# Patient Record
Sex: Female | Born: 1990 | Hispanic: Yes | Marital: Single | Smoking: Never smoker
Health system: Southern US, Community
[De-identification: ages and names within clinical notes are randomized; demographics above are authoritative.]

## PROBLEM LIST (undated history)

## (undated) DIAGNOSIS — J45909 Unspecified asthma, uncomplicated: Secondary | ICD-10-CM

## (undated) DIAGNOSIS — K589 Irritable bowel syndrome without diarrhea: Secondary | ICD-10-CM

## (undated) DIAGNOSIS — N200 Calculus of kidney: Secondary | ICD-10-CM

## (undated) HISTORY — PX: KIDNEY STONE SURGERY: SHX686

## (undated) HISTORY — PX: CHOLECYSTECTOMY: SHX55

## (undated) HISTORY — PX: LASIK: SHX215

---

## 2015-01-27 DIAGNOSIS — R5382 Chronic fatigue, unspecified: Secondary | ICD-10-CM | POA: Insufficient documentation

## 2015-01-27 DIAGNOSIS — K580 Irritable bowel syndrome with diarrhea: Secondary | ICD-10-CM | POA: Insufficient documentation

## 2015-01-27 DIAGNOSIS — K219 Gastro-esophageal reflux disease without esophagitis: Secondary | ICD-10-CM | POA: Insufficient documentation

## 2015-01-27 DIAGNOSIS — E282 Polycystic ovarian syndrome: Secondary | ICD-10-CM | POA: Insufficient documentation

## 2015-01-28 DIAGNOSIS — R519 Headache, unspecified: Secondary | ICD-10-CM | POA: Insufficient documentation

## 2016-06-07 DIAGNOSIS — F341 Dysthymic disorder: Secondary | ICD-10-CM | POA: Insufficient documentation

## 2016-06-07 DIAGNOSIS — L659 Nonscarring hair loss, unspecified: Secondary | ICD-10-CM | POA: Insufficient documentation

## 2017-06-12 DIAGNOSIS — N200 Calculus of kidney: Secondary | ICD-10-CM | POA: Insufficient documentation

## 2017-09-16 DIAGNOSIS — R109 Unspecified abdominal pain: Secondary | ICD-10-CM | POA: Insufficient documentation

## 2018-01-22 DIAGNOSIS — R7401 Elevation of levels of liver transaminase levels: Secondary | ICD-10-CM | POA: Insufficient documentation

## 2019-04-17 DIAGNOSIS — Z20828 Contact with and (suspected) exposure to other viral communicable diseases: Secondary | ICD-10-CM | POA: Insufficient documentation

## 2020-02-23 ENCOUNTER — Emergency Department: Payer: Self-pay

## 2020-02-23 ENCOUNTER — Emergency Department
Admission: EM | Admit: 2020-02-23 | Discharge: 2020-02-23 | Disposition: A | Discharge status: Home / Self Care | Payer: Self-pay | Attending: Emergency Medicine | Admitting: Emergency Medicine

## 2020-02-23 ENCOUNTER — Other Ambulatory Visit: Payer: Self-pay

## 2020-02-23 ENCOUNTER — Encounter: Payer: Self-pay | Admitting: Emergency Medicine

## 2020-02-23 DIAGNOSIS — Z9049 Acquired absence of other specified parts of digestive tract: Secondary | ICD-10-CM | POA: Insufficient documentation

## 2020-02-23 DIAGNOSIS — J4521 Mild intermittent asthma with (acute) exacerbation: Secondary | ICD-10-CM | POA: Insufficient documentation

## 2020-02-23 HISTORY — DX: Unspecified asthma, uncomplicated: J45.909

## 2020-02-23 HISTORY — DX: Calculus of kidney: N20.0

## 2020-02-23 HISTORY — DX: Irritable bowel syndrome, unspecified: K58.9

## 2020-02-23 MED ORDER — ALBUTEROL SULFATE HFA 108 (90 BASE) MCG/ACT IN AERS: 2.0000 | INHALATION_SPRAY | Freq: Four times a day (QID) | RESPIRATORY_TRACT | 0 refills | Status: DC | PRN

## 2020-02-23 NOTE — ED Provider Notes (Signed)
[facility] Emergency Department Provider Note  ____________________________________________   First MD Initiated Contact with Patient 02/23/20 1243     (approximate)  I have reviewed the triage vital signs and the nursing notes.   HISTORY  Chief Complaint Shortness of Breath    HPI Patricia Bowers is a 29 y.o. female presents emergency department via EMS for shortness of breath.  Patient has asthma and is easily triggered by cigarette smoke.  States they have a neighbor that smokes heavily.  He smokes outside after a window in front door which affects her breathing.  States she had a lot of difficulty breathing after she opened the door and inhaled the smoke.  She denies any chest pain or shortness of breath at this time.  She does not have an inhaler at home.  She denies any fever or chills although she states she did feel warm earlier today.  No Covid exposures.  Remainder review of systems is negative    Past Medical History:  Diagnosis Date  . Asthma   . IBS (irritable bowel syndrome)   . Kidney stone     There are no problems to display for this patient.   Past Surgical History:  Procedure Laterality Date  . CHOLECYSTECTOMY    . KIDNEY STONE SURGERY    . LASIK      Prior to Admission medications   Medication Sig Start Date End Date Taking? Authorizing Provider  albuterol (VENTOLIN HFA) 108 (90 Base) MCG/ACT inhaler Inhale 2 puffs into the lungs every 6 (six) hours as needed for wheezing or shortness of breath. 02/23/20   Faythe Ghee, PA-C    Allergies Morphine and related  No family history on file.  Social History Social History   Tobacco Use  . Smoking status: Never Smoker  . Smokeless tobacco: Never Used  Substance Use Topics  . Alcohol use: Never  . Drug use: Not on file    Review of Systems  Constitutional: No fever/chills Eyes: No visual changes. ENT: No sore throat. Respiratory: Denies cough, difficulty  breathing with asthma Cardiovascular: Denies chest pain Gastrointestinal: Denies abdominal pain Genitourinary: Negative for dysuria. Musculoskeletal: Negative for back pain. Skin: Negative for rash. Psychiatric: no mood changes,     ____________________________________________   PHYSICAL EXAM:  VITAL SIGNS: ED Triage Vitals  Enc Vitals Group     BP 02/23/20 1201 118/72     Pulse Rate 02/23/20 1201 88     Resp 02/23/20 1201 16     Temp 02/23/20 1201 98.7 F (37.1 C)     Temp Source 02/23/20 1201 Oral     SpO2 02/23/20 1201 97 %     Weight 02/23/20 1201 260 lb (117.9 kg)     Height 02/23/20 1201 5\' 3"  (1.6 m)     Head Circumference --      Peak Flow --      Pain Score 02/23/20 1200 4     Pain Loc --      Pain Edu? --      Excl. in GC? --     Constitutional: Alert and oriented. Well appearing and in no acute distress. Eyes: Conjunctivae are normal.  Head: Atraumatic. Nose: No congestion/rhinnorhea. Neck:  supple no lymphadenopathy noted Cardiovascular: Normal rate, regular rhythm. Heart sounds are normal Respiratory: Normal respiratory effort.  No retractions, lungs c t a, no wheezing GU: deferred Musculoskeletal: FROM all extremities, warm and well perfused Neurologic:  Normal speech and language.  Skin:  Skin is warm, dry and intact. No rash noted. Psychiatric: Mood and affect are normal. Speech and behavior are normal.  ____________________________________________   LABS (all labs ordered are listed, but only abnormal results are displayed)  Labs Reviewed - No data to display ____________________________________________   ____________________________________________  RADIOLOGY    ____________________________________________   PROCEDURES  Procedure(s) performed: No  Procedures    ____________________________________________   INITIAL IMPRESSION / ASSESSMENT AND PLAN / ED COURSE  Pertinent labs & imaging results that were available during  my care of the patient were reviewed by me and considered in my medical decision making (see chart for details).   Patient is 29 year old female presents emergency department for an asthma exasperation see HPI  Physical exam shows patient to appear well.  Vitals are normal.  Lungs clear to all station.  Remainder exams are unremarkable  Explained to the patient that the hospital cannot do anything about her neighbor.  Unfortunately she will need to once again contact the apartment Freight forwarder.  She was given a prescription for an albuterol inhaler which was sent to medication management.  Patient states she is unemployed and uninsured at this time so told her they would be able to help her.  I do not feel that she needs a nebulizer treatment or chest x-ray at this time.  Patient seems to be more upset about the fact that her neighbor keeps smoking and causes exacerbations of her asthma.  She was discharged stable condition.    Patricia Bowers was evaluated in Emergency Department on 02/23/2020 for the symptoms described in the history of present illness. She was evaluated in the context of the global COVID-19 pandemic, which necessitated consideration that the patient might be at risk for infection with the SARS-CoV-2 virus that causes COVID-19. Institutional protocols and algorithms that pertain to the evaluation of patients at risk for COVID-19 are in a state of rapid change based on information released by regulatory bodies including the CDC and federal and state organizations. These policies and algorithms were followed during the patient's care in the ED.   As part of my medical decision making, I reviewed the following data within the [place] notes reviewed and incorporated, Old chart reviewed, Notes from prior ED visits and [place] Controlled Substance Database  ____________________________________________   FINAL CLINICAL IMPRESSION(S) / ED DIAGNOSES  Final diagnoses:    Mild intermittent asthma with acute exacerbation      NEW MEDICATIONS STARTED DURING THIS VISIT:  New Prescriptions   ALBUTEROL (VENTOLIN HFA) 108 (90 BASE) MCG/ACT INHALER    Inhale 2 puffs into the lungs every 6 (six) hours as needed for wheezing or shortness of breath.     Note:  This document was prepared using Dragon voice recognition software and may include unintentional dictation errors.    Versie Starks, PA-C 02/23/20 1301    Blake Divine, MD 02/23/20 [phone]

## 2020-02-23 NOTE — ED Triage Notes (Signed)
Patient presents to the ED via EMS for shortness of breath.  Patient reports history of asthma and states that people in her apartment building have been smoking pot and the smell/smoke is exacerbating her asthma and causing her to have difficulty breathing.  Patient reports some chest tightness due to rapid breathing.  Patient is in no obvious distress at this time.

## 2020-02-23 NOTE — Discharge Instructions (Addendum)
Follow-up with your regular doctor or one of the discounted clinics as needed.  Return emergency department if worsening.  Use the inhaler as prescribed.  Wear your mask when you start to go outside.  This should help decrease some exposure to cigarette smoke that is outside.

## 2020-03-30 ENCOUNTER — Telehealth: Payer: Self-pay | Admitting: Pharmacy Technician

## 2020-03-30 NOTE — Telephone Encounter (Signed)
Patient failed to provide 2021 proof of income.  No additional medication assistance will be provided by [facility] without the required proof of income documentation.  Patient notified by letter.  Patricia Bowers Care Manager [facility]   [address] [place], [place]  [address]     This is to inform you that you are no longer eligible to receive medication assistance at [facility].  The reason(s) are:    _____Your total gross monthly household income exceeds 250% of the Federal Poverty Level.   _____Tangible assets (savings, checking, stocks/bonds, pension, retirement, etc.) exceeds our limit  _____You are eligible to receive benefits from Medicaid, [facility] or HIV Medication              Assistance Program _____You are eligible to receive benefits from a Medicare Part "D" plan _____You have prescription insurance  _____You are not an [place] resident __X__Failure to provide all requested proof of income information for 2021.    Medication assistance will resume once all requested financial information has been returned to our clinic.  If you have questions, please contact our clinic at [phone].    Thank you,  [facility] 

## 2020-05-23 ENCOUNTER — Ambulatory Visit: Payer: Self-pay | Admitting: Gerontology

## 2020-05-25 ENCOUNTER — Ambulatory Visit: Payer: Self-pay | Admitting: Gerontology

## 2020-05-25 ENCOUNTER — Other Ambulatory Visit: Payer: Self-pay

## 2020-05-25 ENCOUNTER — Encounter: Payer: Self-pay | Admitting: Gerontology

## 2020-05-25 VITALS — BP 137/98 | HR 89 | Ht 63.0 in | Wt 263.0 lb

## 2020-05-25 DIAGNOSIS — Z8709 Personal history of other diseases of the respiratory system: Secondary | ICD-10-CM | POA: Insufficient documentation

## 2020-05-25 DIAGNOSIS — Z7689 Persons encountering health services in other specified circumstances: Secondary | ICD-10-CM

## 2020-05-25 DIAGNOSIS — Z8719 Personal history of other diseases of the digestive system: Secondary | ICD-10-CM

## 2020-05-25 MED ORDER — ALBUTEROL SULFATE HFA 108 (90 BASE) MCG/ACT IN AERS: 2.0000 | INHALATION_SPRAY | Freq: Four times a day (QID) | RESPIRATORY_TRACT | 0 refills | Status: DC | PRN

## 2020-05-25 MED ORDER — FLUTICASONE-SALMETEROL 250-50 MCG/DOSE IN AEPB: 1.0000 | INHALATION_SPRAY | Freq: Every day | RESPIRATORY_TRACT | 1 refills | Status: DC

## 2020-05-25 MED ORDER — SALINE SPRAY 0.65 % NA SOLN: 1.0000 | NASAL | 0 refills | Status: DC | PRN

## 2020-05-25 NOTE — Progress Notes (Signed)
Patient ID: Patricia Bowers, female   DOB: [birth year], 29 y.o.   MRN: [id number]  Chief Complaint  Patient presents with  . Establish Care  . Recurrent Sinusitis    feels ongoing since Nov, mold was found in old apartment, 2 bouts of E Coli, sinus drainage and cough for several months  . Cough    HPI Patricia Bowers is a 29 y.o. female who presents to establish care and evaluation of her chronic conditions. She was seen at [facility] on 05/06/2020 for insect bite to right lower leg and was treated with Mupirocin antibiotic ointment. Currently, she states that the insect bite to her right lower leg has resolved.She was also seen at the ED on 02/23/2020 for Shortness of breath. She has a history of Asthma and she uses Albuterol as needed. Currently, she states that she experiences mild intermittent shortness of breath with exertion, mild chest tightness and non productive cough sometimes. She also c/o sore throat, sinus pressure, postnasal drip that has been going on since 10/2019. She denies rhinorrhea, otalgia and headache. She also states that she has a history of Inflammatory Bowel Disease (IBS), continues to have fifteen loose stools daily. She denies abdominal pain and hematochezia. Overall, she states that she's doing well and offers no further complaint.   Past Medical History:  Diagnosis Date  . Asthma   . IBS (irritable bowel syndrome)   . Kidney stone     Past Surgical History:  Procedure Laterality Date  . CHOLECYSTECTOMY    . KIDNEY STONE SURGERY    . LASIK      History reviewed. No pertinent family history.  Social History Social History   Tobacco Use  . Smoking status: Never Smoker  . Smokeless tobacco: Never Used  Substance Use Topics  . Alcohol use: Never  . Drug use: Never    Allergies  Allergen Reactions  . Morphine And Related Other (See Comments)    Dizziness, nausea, nightmares    Current Outpatient Medications  Medication Sig Dispense Refill   . albuterol (VENTOLIN HFA) 108 (90 Base) MCG/ACT inhaler Inhale 2 puffs into the lungs every 6 (six) hours as needed for wheezing or shortness of breath. 16 g 0   No current facility-administered medications for this visit.    Review of Systems Review of Systems  HENT: Positive for postnasal drip, sinus pressure, sinus pain and sore throat.   Eyes: Negative.   Respiratory: Positive for chest tightness. Negative for wheezing.   Gastrointestinal: Negative.   Endocrine: Negative.   Genitourinary: Negative.   Musculoskeletal: Negative.   Skin: Negative.   Neurological: Negative.   Psychiatric/Behavioral: Negative.     Blood pressure (!) 137/98, pulse 89, height '5\' 3"'  (1.6 m), weight 263 lb (119.3 kg), SpO2 98 %.  Physical Exam Physical Exam Constitutional:      Appearance: Normal appearance.  HENT:     Head: Normocephalic and atraumatic.     Right Ear: Tympanic membrane, ear canal and external ear normal.     Left Ear: Tympanic membrane, ear canal and external ear normal.     Nose: Nose normal. No nasal deformity, septal deviation, nasal tenderness or mucosal edema.     Right Turbinates: Not enlarged, swollen or pale.     Left Turbinates: Not enlarged, swollen or pale.     Right Sinus: No maxillary sinus tenderness or frontal sinus tenderness.     Left Sinus: No maxillary sinus tenderness or frontal sinus tenderness.     Mouth/Throat:  Lips: Pink.     Mouth: Mucous membranes are moist.     Pharynx: Oropharynx is clear. Uvula midline. No oropharyngeal exudate or posterior oropharyngeal erythema.     Tonsils: No tonsillar exudate or tonsillar abscesses.  Eyes:     Extraocular Movements: Extraocular movements intact.     Conjunctiva/sclera: Conjunctivae normal.     Pupils: Pupils are equal, round, and reactive to light.  Cardiovascular:     Rate and Rhythm: Normal rate and regular rhythm.     Pulses: Normal pulses.     Heart sounds: Normal heart sounds.  Pulmonary:      Effort: Pulmonary effort is normal.     Breath sounds: Normal breath sounds.  Abdominal:     General: Bowel sounds are normal.     Palpations: Abdomen is soft.  Genitourinary:    Comments: Deferred per patient. Musculoskeletal:        General: Normal range of motion.     Cervical back: Normal range of motion.  Skin:    General: Skin is warm and dry.  Neurological:     General: No focal deficit present.     Mental Status: She is alert and oriented to person, place, and time. Mental status is at baseline.  Psychiatric:        Mood and Affect: Mood normal.        Behavior: Behavior normal.        Thought Content: Thought content normal.        Judgment: Judgment normal.     Data Reviewed Lab and past medical history was reviewed.  Assessment and Plan  1. History of asthma -  She will continue on current medication, was educated on side effects and advised to notify clinic. - albuterol (VENTOLIN HFA) 108 (90 Base) MCG/ACT inhaler; Inhale 2 puffs into the lungs every 6 (six) hours as needed for wheezing or shortness of breath.  Dispense: 16 g; Refill: 0 - Fluticasone-Salmeterol (ADVAIR) 250-50 MCG/DOSE AEPB; Inhale 1 puff into the lungs daily.  Dispense: 60 each; Refill: 1 - sodium chloride (OCEAN) 0.65 % SOLN nasal spray; Place 1 spray into both nostrils as needed for congestion.  Dispense: 15 mL; Refill: 0  2. History of IBS - She was advised to complete [facility] financial application for - Ambulatory referral to Gastroenterology for evaluation of IBS.  3. Encounter to establish care - Routine labs will be checked. - CBC w/Diff; Future - Comp Met (CMET); Future - Lipid panel; Future - Urinalysis; Future - TSH; Future - Pregnancy, urine - Ambulatory referral to Hematology / Oncology   Follow up: 06/21/2020 if symptom worsens or fails to improve.  Halyn Flaugher E Colette Dicamillo 05/25/2020, 3:26 PM

## 2020-06-01 ENCOUNTER — Encounter: Payer: Self-pay | Admitting: Emergency Medicine

## 2020-06-01 ENCOUNTER — Other Ambulatory Visit: Payer: Self-pay

## 2020-06-01 ENCOUNTER — Ambulatory Visit
Admission: EM | Admit: 2020-06-01 | Discharge: 2020-06-01 | Disposition: A | Discharge status: Home / Self Care | Payer: Self-pay | Attending: Urgent Care | Admitting: Urgent Care

## 2020-06-01 ENCOUNTER — Telehealth: Payer: Self-pay | Admitting: Pharmacy Technician

## 2020-06-01 ENCOUNTER — Ambulatory Visit (INDEPENDENT_AMBULATORY_CARE_PROVIDER_SITE_OTHER): Payer: Self-pay

## 2020-06-01 DIAGNOSIS — M25512 Pain in left shoulder: Secondary | ICD-10-CM

## 2020-06-01 DIAGNOSIS — W01190A Fall on same level from slipping, tripping and stumbling with subsequent striking against furniture, initial encounter: Secondary | ICD-10-CM

## 2020-06-01 MED ORDER — MELOXICAM 15 MG PO TABS: 15.0000 mg | ORAL_TABLET | Freq: Every day | ORAL | 0 refills | Status: DC

## 2020-06-01 MED ORDER — TRAMADOL HCL 50 MG PO TABS: 50.0000 mg | ORAL_TABLET | Freq: Two times a day (BID) | ORAL | 0 refills | Status: DC | PRN

## 2020-06-01 NOTE — Telephone Encounter (Signed)
Received updated proof of income.  Patient eligible to receive medication assistance at [facility] until time for re-certification in 9359, and as long as eligibility requirements continue to be met.  [facility]

## 2020-06-01 NOTE — Discharge Instructions (Addendum)
It was very nice seeing you today in clinic. Thank you for entrusting me with your care.   Rest, ice, and elevate arm. Use medications as prescribed.    Make arrangements to follow up with your regular doctor in 1 week for re-evaluation if not improving. If your symptoms/condition worsens, please seek follow up care either here or in the ER. Please remember, our [facility] providers are "right here with you" when you need [place].   Again, it was my pleasure to take care of you today. Thank you for choosing our clinic. I hope that you start to feel better quickly.   Quentin Mulling, MSN, APRN, FNP-C, CEN Advanced Practice Provider [facility]

## 2020-06-01 NOTE — ED Triage Notes (Signed)
Patient in today c/o right shoulder pain after falling last night and landing on her right shoulder. Patient has applied ice to her shoulder without relief.

## 2020-06-02 ENCOUNTER — Ambulatory Visit (INDEPENDENT_AMBULATORY_CARE_PROVIDER_SITE_OTHER): Payer: Self-pay | Admitting: Gastroenterology

## 2020-06-02 ENCOUNTER — Other Ambulatory Visit: Payer: Self-pay

## 2020-06-02 ENCOUNTER — Encounter: Payer: Self-pay | Admitting: Gastroenterology

## 2020-06-02 VITALS — BP 134/96 | HR 90 | Temp 98.2°F | Ht 63.0 in | Wt 263.2 lb

## 2020-06-02 DIAGNOSIS — K580 Irritable bowel syndrome with diarrhea: Secondary | ICD-10-CM

## 2020-06-02 DIAGNOSIS — K219 Gastro-esophageal reflux disease without esophagitis: Secondary | ICD-10-CM

## 2020-06-02 MED ORDER — HYOSCYAMINE SULFATE 0.125 MG PO TABS: 0.1250 mg | ORAL_TABLET | ORAL | 0 refills | Status: AC | PRN

## 2020-06-02 MED ORDER — AMITRIPTYLINE HCL 25 MG PO TABS: 25.0000 mg | ORAL_TABLET | Freq: Every day | ORAL | 2 refills | Status: DC

## 2020-06-02 MED ORDER — OMEPRAZOLE 40 MG PO CPDR: 40.0000 mg | DELAYED_RELEASE_CAPSULE | Freq: Every day | ORAL | 2 refills | Status: DC

## 2020-06-02 NOTE — ED Provider Notes (Signed)
[place], [place]   Name: Patricia Bowers DOB: 1991-08-21 MRN: [id number] CSN: [id number] PCP: Patient, No Pcp Per  Arrival date and time:  06/01/20 1807  Chief Complaint:  Fall and Shoulder Pain  NOTE: Prior to seeing the patient today, I have reviewed the triage nursing documentation and vital signs. Clinical staff has updated patient's PMH/PSHx, current medication list, and drug allergies/intolerances to ensure comprehensive history available to assist in medical decision making.   History:   HPI: Patricia Bowers is a 29 y.o. female who presents today with complaints of pain in her RIGHT shoulder following a mechanical fall that occurred on 05/31/2020. Patient describes that injury occurred when she tripped over some boxes that were in her kitchen floor. Tripping mechanism caused her to fall into her trash can and subsequently onto a wooden chair. Patient presents with reports of intense pain and reduced AROM. She is observed with arm tucked to her side; refusing to move. She denies previous injuries to her shoulder; no surgeries. Patient has no other injuries stemming from her fall. Despite her symptoms, patient has not taken any over the counter interventions to help improve/relieve her reported symptoms at home.   Past Medical History:  Diagnosis Date   Asthma    IBS (irritable bowel syndrome)    Kidney stone     Past Surgical History:  Procedure Laterality Date   CHOLECYSTECTOMY     KIDNEY STONE SURGERY     LASIK      Family History  Problem Relation Age of Onset   Breast cancer Mother    Other Father        unknown medical history    Social History   Tobacco Use   Smoking status: Never Smoker   Smokeless tobacco: Never Used  Substance Use Topics   Alcohol use: Never   Drug use: Never    Patient Active Problem List   Diagnosis Date Noted   History of asthma 05/25/2020   Contact with or exposure to viral disease 04/17/2019   Exposure to  SARS-associated coronavirus 04/17/2019   Right flank pain 09/16/2017   Nephrolithiasis 06/12/2017   Dysthymia 06/07/2016   Hair loss 06/07/2016   Morbid obesity due to excess calories (HCC) 06/07/2016   Nonintractable episodic headache 01/28/2015   Chronic fatigue 01/27/2015   Gastroesophageal reflux disease without esophagitis 01/27/2015   Irritable bowel syndrome with diarrhea 01/27/2015   Polycystic ovarian syndrome 01/27/2015    Home Medications:    Current Meds  Medication Sig   albuterol (VENTOLIN HFA) 108 (90 Base) MCG/ACT inhaler Inhale 2 puffs into the lungs every 6 (six) hours as needed for wheezing or shortness of breath.   Fluticasone-Salmeterol (ADVAIR) 250-50 MCG/DOSE AEPB Inhale 1 puff into the lungs daily.   [DISCONTINUED] sodium chloride (OCEAN) 0.65 % SOLN nasal spray Place 1 spray into both nostrils as needed for congestion. (Patient not taking: Reported on 06/02/2020)    Allergies:   Morphine and related  Review of Systems (ROS):  Review of systems NEGATIVE unless otherwise noted in narrative H&P section.   Vital Signs: Today's Vitals   06/01/20 1824 06/01/20 1825 06/01/20 1828 06/01/20 1919  BP:   111/74   Pulse:   86   Resp:   18   Temp:   99.1 F (37.3 C)   TempSrc:   Oral   SpO2:   99%   Weight:  265 lb (120.2 kg)    Height:  5\' 3"  (1.6 m)    PainSc: 8  8     Physical Exam: Physical Exam  Constitutional: She is oriented to person, place, and time and well-developed, well-nourished, and in no distress.  HENT:  Head: Normocephalic and atraumatic.  Eyes: Pupils are equal, round, and reactive to light.  Cardiovascular: Normal rate, regular rhythm, normal heart sounds and intact distal pulses.  Pulmonary/Chest: Effort normal and breath sounds normal.  Musculoskeletal:     Right shoulder: Swelling (mild), tenderness and pain present. No effusion, crepitus or spasms. Decreased range of motion. Decreased strength (2/2 acute pain).  Normal pulse.     Comments: (+) PMS noted distally; normal color, temperature, and capillary refill.  Neurological: She is alert and oriented to person, place, and time. She has normal sensation. Gait normal.  Skin: Skin is warm and dry. No rash noted. She is not diaphoretic.  Psychiatric: Memory, affect and judgment normal. Her mood appears anxious.  Nursing note and vitals reviewed.   Urgent Care Treatments / Results:   Orders Placed This Encounter  Procedures   DG Shoulder Right   Apply shoulder sling    LABS: PLEASE NOTE: all labs that were ordered this encounter are listed, however only abnormal results are displayed. Labs Reviewed - No data to display  EKG: -None  RADIOLOGY: DG Shoulder Right  Result Date: 06/01/2020 CLINICAL DATA:  Fall yesterday with shoulder pain, initial encounter EXAM: RIGHT SHOULDER - 2+ VIEW COMPARISON:  None. FINDINGS: There is no evidence of fracture or dislocation. There is no evidence of arthropathy or other focal bone abnormality. Soft tissues are unremarkable. IMPRESSION: No acute abnormality noted. Electronically Signed   By: Inez Catalina M.D.   On: 06/01/2020 19:02    PROCEDURES: Procedures  MEDICATIONS RECEIVED THIS VISIT: Medications - No data to display  PERTINENT CLINICAL COURSE NOTES/UPDATES:   Initial Impression / Assessment and Plan / Urgent Care Course:  Pertinent labs & imaging results that were available during my care of the patient were personally reviewed by me and considered in my medical decision making (see lab/imaging section of note for values and interpretations).  Patricia Bowers is a 29 y.o. female who presents to [facility] today with complaints of Fall and Shoulder Pain  Patient is well appearing overall in clinic today. She does not appear to be in any acute distress. Presenting symptoms (see HPI) and exam as documented above. .Diagnostic radiographs of the RIGHT shoulder revealed no acute  abnormalities; no fracture, dislocation, or effusion. Suspect pain related to shoulder contusion following her fall. She has no pain in her scapular area or humerus on exam. Discussed that the absence of osseous abnormalities do not rule out a more serious issues with the shoulder. I explained that there could still be the potential for ligamentous and/or cartilaginous injuries that are not visible on plain radiographs. Patient placed in an extremity sling for support and comfort. Will pursue treatment using anti-inflammatory medication (meloxicam). She was educated on complimentary modalities to help with her pain. Patient encouraged to rest, ice, and elevate her shoulder. Reviewed importance of ROM exercises to expedite recovery. Discussed that total immobilization will result in muscle spasm and could potentially lead to capsulitis. Encouraged patient to at least dangle arm and allow for pendulum swings making small circles in clockwise and counter-clockwise motions. Pain is significant in clinic. I am concerned that she will neglect the recommended ROM due to her acute pain. Patient reports that she is unable to take pain medications as they have all caused her to experience nightmares.  Patient does note that she has successfully taken Tramadol in the past for urolithiasis and tolerated it well. Short course supply of Tramadol sent in for PRN use; indications and side effects reviewed.   Discussed follow up with primary care physician in 1 week for re-evaluation. I have reviewed the follow up and strict return precautions for any new or worsening symptoms. Patient is aware of symptoms that would be deemed urgent/emergent, and would thus require further evaluation either here or in the emergency department. At the time of discharge, she verbalized understanding and consent with the discharge plan as it was reviewed with her. All questions were fielded by provider and/or clinic staff prior to patient discharge.     Final Clinical Impressions / Urgent Care Diagnoses:   Final diagnoses:  Acute pain of left shoulder  Fall on same level from slipping, tripping and stumbling with subsequent striking against furniture, initial encounter    New Prescriptions:  [place] Controlled Substance Registry consulted? Yes, I have consulted the [place] Controlled Substances Registry for this patient, and feel the risk/benefit ratio today is favorable for proceeding with this prescription for a controlled substance.   Discussed use of controlled substance medication to treat her acute pain.  o Reviewed Time STOP Act regulations  o Clinic does not refill controlled substances over the phone without face to face evaluation.   Safety precautions reviewed.  o Medications should not be bitten, chewed, sold, or taken with alcohol.  o Avoid use while working, driving, or operating heavy machinery.  o Side effects associated with the use of this particular medication reviewed. - Patient understands that this medication can cause CNS depression, increase her risk of falls, and even lead to overdose that may result in death, if used outside of the parameters that she and I discussed.  With all of this in mind, she knowingly accepts the risks and responsibilities associated with intended course of treatment, and elects to responsibly proceed as discussed.  Meds ordered this encounter  Medications   meloxicam (MOBIC) 15 MG tablet    Sig: Take 1 tablet (15 mg total) by mouth daily.    Dispense:  30 tablet    Refill:  0   DISCONTD: traMADol (ULTRAM) 50 MG tablet    Sig: Take 1 tablet (50 mg total) by mouth every 12 (twelve) hours as needed.    Dispense:  10 tablet    Refill:  0   traMADol (ULTRAM) 50 MG tablet    Sig: Take 1 tablet (50 mg total) by mouth every 12 (twelve) hours as needed.    Dispense:  10 tablet    Refill:  0    Recommended Follow up Care:  Patient encouraged to follow up with the following provider within  the specified time frame, or sooner as dictated by the severity of her symptoms. As always, she was instructed that for any urgent/emergent care needs, she should seek care either here or in the emergency department for more immediate evaluation.  Follow-up Information    PCP In 1 week.   Why: General reassessment of symptoms if not improving        NOTE: This note was prepared using Scientist, clinical (histocompatibility and immunogenetics) along with smaller Lobbyist. Despite my best ability to proofread, there is the potential that transcriptional errors may still occur from this process, and are completely unintentional.    Verlee Monte, NP 06/02/20 1912

## 2020-06-02 NOTE — Progress Notes (Signed)
Patricia Repress, MD [address]  [address]  [place], [place] [address]  Main: [phone]  Fax: [phone]    Gastroenterology Consultation  Referring Provider:     Rolm Gala, NP Primary Care Physician:  Patient, No Pcp Per Primary Gastroenterologist:  Dr. Arlyss Bowers Reason for Consultation:     IBS, GERD        HPI:   Patricia Bowers is a 29 y.o. female referred by Dr. Patient, No Pcp Per  for consultation & management of IBS, GERD Irritable bowel syndrome: Patient reports that when she was in [place], at age of 77, she was experiencing left-sided abdominal pain associated with abdominal cramps and nonbloody diarrhea.  She denies abdominal bloating.  She said she had undergone extensive work-up and she was told that she has irritable bowel syndrome.  She was taking Bentyl as needed for her symptoms.  Over the course of years, she was experiencing alternating episodes of constipation and diarrhea.  When she moved to [place], in 2018 she was evaluated by gastroenterologist for ongoing symptoms.  At that time, she underwent upper endoscopy as well as colonoscopy which were reportedly normal.  There was no evidence of H. pylori.  Celiac serologies were negative.  TSH normal.  No evidence of anemia.  She was on sublingual hyoscyamine in the past. She does acknowledge significant family stress in her personal life.  Unable to sleep well.  Patient is accompanied by her mom today.  She identified that she cannot tolerate lactose and gluten, therefore avoiding these  Chronic GERD: She reports heartburn, acid reflux for which she is taking pantoprazole.  She denies dysphagia, epigastric pain  Patient went to ER yesterday due to right shoulder pain after she fell last night and landing on her right shoulder.  There was no evidence of fracture.  She was discharged home on meloxicam and tramadol which she did not fill the prescription yet.  NSAIDs:  None  Antiplts/Anticoagulants/Anti thrombotics: None  GI Procedures: EGD and colonoscopy in 11/05/2017 at [facility] A.  Gastric antrum, biopsy: -Mild reactive gastropathy. -No Helicobacter pylori microorganisms identified on H&E staining.  B.  Distal esophagus, biopsy: -Mucosa of the squamocolumnar junction. -No goblet cell metaplasia identified.  Past Medical History:  Diagnosis Date  . Asthma   . IBS (irritable bowel syndrome)   . Kidney stone     Past Surgical History:  Procedure Laterality Date  . CHOLECYSTECTOMY    . KIDNEY STONE SURGERY    . LASIK      Current Outpatient Medications:  .  albuterol (VENTOLIN HFA) 108 (90 Base) MCG/ACT inhaler, Inhale 2 puffs into the lungs every 6 (six) hours as needed for wheezing or shortness of breath., Disp: 16 g, Rfl: 0 .  Fluticasone-Salmeterol (ADVAIR) 250-50 MCG/DOSE AEPB, Inhale 1 puff into the lungs daily., Disp: 60 each, Rfl: 1 .  meloxicam (MOBIC) 15 MG tablet, Take 1 tablet (15 mg total) by mouth daily., Disp: 30 tablet, Rfl: 0 .  traMADol (ULTRAM) 50 MG tablet, Take 1 tablet (50 mg total) by mouth every 12 (twelve) hours as needed., Disp: 10 tablet, Rfl: 0 .  amitriptyline (ELAVIL) 25 MG tablet, Take 1 tablet (25 mg total) by mouth at bedtime., Disp: 30 tablet, Rfl: 2 .  hyoscyamine (LEVSIN) 0.125 MG tablet, Take 1 tablet (0.125 mg total) by mouth every 4 (four) hours as needed., Disp: 30 tablet, Rfl: 0 .  omeprazole (PRILOSEC) 40 MG capsule, Take 1 capsule (40  mg total) by mouth daily before breakfast., Disp: 30 capsule, Rfl: 2    Family History  Problem Relation Age of Onset  . Breast cancer Mother   . Other Father        unknown medical history     Social History   Tobacco Use  . Smoking status: Never Smoker  . Smokeless tobacco: Never Used  Substance Use Topics  . Alcohol use: Never  . Drug use: Never    Allergies as of 06/02/2020 - Review Complete 06/02/2020  Allergen Reaction Noted  . Morphine and related  Other (See Comments) 02/23/2020    Review of Systems:    All systems reviewed and negative except where noted in HPI.   Physical Exam:  BP (!) 134/96 (BP Location: Left Arm, Patient Position: Sitting, Cuff Size: Large)   Pulse 90   Temp 98.2 F (36.8 C) (Oral)   Ht 5\' 3"  (1.6 m)   Wt 263 lb 4 oz (119.4 kg)   LMP 05/20/2020 (Approximate)   BMI 46.63 kg/m  Patient's last menstrual period was 05/20/2020 (approximate).  General:   Alert,  Well-developed, well-nourished, pleasant and cooperative in NAD Head:  Normocephalic and atraumatic. Eyes:  Sclera clear, no icterus.   Conjunctiva pink. Ears:  Normal auditory acuity. Nose:  No deformity, discharge, or lesions. Mouth:  No deformity or lesions,oropharynx pink & moist. Neck:  Supple; no masses or thyromegaly. Lungs:  Respirations even and unlabored.  Clear throughout to auscultation.   No wheezes, crackles, or rhonchi. No acute distress. Heart:  Regular rate and rhythm; no murmurs, clicks, rubs, or gallops. Abdomen:  Normal bowel sounds. Soft, non-tender and non-distended without masses, hepatosplenomegaly or hernias noted.  No guarding or rebound tenderness.   Rectal: Not performed Msk:  Symmetrical without gross deformities. Good, equal movement & strength bilaterally. Pulses:  Normal pulses noted. Extremities:  No clubbing or edema.  No cyanosis. Neurologic:  Alert and oriented x3;  grossly normal neurologically. Skin:  Intact without significant lesions or rashes. No jaundice. Psych:  Alert and cooperative. Normal mood and affect.  Imaging Studies: No abdominal imaging  Assessment and Plan:   [name] is a 29 y.o. female with morbid obesity is seen in consultation for management of diarrhea predominant irritable bowel syndrome, chronic GERD  IBS-diarrhea Discussed in detail about the symptoms, what to expect, management with various modalities, alleviating stress, avoidance of any dietary triggers, role of  antibiotics, role of antipsychiatric medications Given significant history of stress in her life and insomnia likely contributing to her GI symptoms, I recommended trial of amitriptyline 25 mg at bedtime and increase to 2 mg in 2 weeks which she is tolerating Continue hyoscyamine as needed If symptoms are persisting, will try 2 weeks course of rifaximin   Chronic GERD Discussed about antireflux lifestyle, information provided Trial of omeprazole 40 mg daily before meals Increase physical activity and weight loss    Follow up in 2 months   Cephas Darby, MD

## 2020-06-06 ENCOUNTER — Telehealth: Payer: Self-pay | Admitting: Gerontology

## 2020-06-06 NOTE — Telephone Encounter (Signed)
LVM to r/s appt

## 2020-06-07 ENCOUNTER — Other Ambulatory Visit: Payer: Self-pay

## 2020-06-07 DIAGNOSIS — Z7689 Persons encountering health services in other specified circumstances: Secondary | ICD-10-CM

## 2020-06-09 LAB — CBC WITH DIFFERENTIAL/PLATELET
Basophils Absolute: 0 10*3/uL (ref 0.0–0.2)
Basos: 1 %
EOS (ABSOLUTE): 0.2 10*3/uL (ref 0.0–0.4)
Eos: 3 %
Hematocrit: 39.2 % (ref 34.0–46.6)
Hemoglobin: 13.3 g/dL (ref 11.1–15.9)
Immature Grans (Abs): 0.1 10*3/uL (ref 0.0–0.1)
Immature Granulocytes: 1 %
Lymphocytes Absolute: 2.4 10*3/uL (ref 0.7–3.1)
Lymphs: 36 %
MCH: 28.4 pg (ref 26.6–33.0)
MCHC: 33.9 g/dL (ref 31.5–35.7)
MCV: 84 fL (ref 79–97)
Monocytes Absolute: 0.4 10*3/uL (ref 0.1–0.9)
Monocytes: 5 %
Neutrophils Absolute: 3.8 10*3/uL (ref 1.4–7.0)
Neutrophils: 54 %
Platelets: 246 10*3/uL (ref 150–450)
RBC: 4.68 x10E6/uL (ref 3.77–5.28)
RDW: 13.4 % (ref 11.7–15.4)
WBC: 6.9 10*3/uL (ref 3.4–10.8)

## 2020-06-09 LAB — COMPREHENSIVE METABOLIC PANEL
ALT: 134 IU/L — ABNORMAL HIGH (ref 0–32)
AST: 90 IU/L — ABNORMAL HIGH (ref 0–40)
Albumin/Globulin Ratio: 1.5 (ref 1.2–2.2)
Albumin: 4.1 g/dL (ref 3.9–5.0)
Alkaline Phosphatase: 77 IU/L (ref 48–121)
BUN/Creatinine Ratio: 16 (ref 9–23)
BUN: 10 mg/dL (ref 6–20)
Bilirubin Total: 0.4 mg/dL (ref 0.0–1.2)
CO2: 20 mmol/L (ref 20–29)
Calcium: 9.6 mg/dL (ref 8.7–10.2)
Chloride: 105 mmol/L (ref 96–106)
Creatinine, Ser: 0.63 mg/dL (ref 0.57–1.00)
GFR calc Af Amer: 141 mL/min/{1.73_m2} (ref >59)
GFR calc non Af Amer: 122 mL/min/{1.73_m2} (ref >59)
Globulin, Total: 2.8 g/dL (ref 1.5–4.5)
Glucose: 107 mg/dL — ABNORMAL HIGH (ref 65–99)
Potassium: 3.9 mmol/L (ref 3.5–5.2)
Sodium: 139 mmol/L (ref 134–144)
Total Protein: 6.9 g/dL (ref 6.0–8.5)

## 2020-06-09 LAB — LIPID PANEL
Chol/HDL Ratio: 8.2 ratio — ABNORMAL HIGH (ref 0.0–4.4)
Cholesterol, Total: 287 mg/dL — ABNORMAL HIGH (ref 100–199)
HDL: 35 mg/dL — ABNORMAL LOW (ref >39)
LDL Chol Calc (NIH): 199 mg/dL — ABNORMAL HIGH (ref 0–99)
Triglycerides: 265 mg/dL — ABNORMAL HIGH (ref 0–149)
VLDL Cholesterol Cal: 53 mg/dL — ABNORMAL HIGH (ref 5–40)

## 2020-06-09 LAB — URINALYSIS

## 2020-06-09 LAB — TSH: TSH: 0.886 u[IU]/mL (ref 0.450–4.500)

## 2020-06-13 ENCOUNTER — Ambulatory Visit: Payer: Self-pay | Admitting: Gerontology

## 2020-06-13 ENCOUNTER — Other Ambulatory Visit: Payer: Self-pay

## 2020-06-13 DIAGNOSIS — R748 Abnormal levels of other serum enzymes: Secondary | ICD-10-CM | POA: Insufficient documentation

## 2020-06-13 DIAGNOSIS — J029 Acute pharyngitis, unspecified: Secondary | ICD-10-CM

## 2020-06-13 DIAGNOSIS — Z8719 Personal history of other diseases of the digestive system: Secondary | ICD-10-CM | POA: Insufficient documentation

## 2020-06-13 DIAGNOSIS — R059 Cough, unspecified: Secondary | ICD-10-CM

## 2020-06-13 DIAGNOSIS — E785 Hyperlipidemia, unspecified: Secondary | ICD-10-CM | POA: Insufficient documentation

## 2020-06-13 DIAGNOSIS — G4700 Insomnia, unspecified: Secondary | ICD-10-CM

## 2020-06-13 DIAGNOSIS — K219 Gastro-esophageal reflux disease without esophagitis: Secondary | ICD-10-CM

## 2020-06-13 DIAGNOSIS — K580 Irritable bowel syndrome with diarrhea: Secondary | ICD-10-CM

## 2020-06-13 MED ORDER — BENZONATATE 100 MG PO CAPS: 100.0000 mg | ORAL_CAPSULE | Freq: Two times a day (BID) | ORAL | 0 refills | Status: DC | PRN

## 2020-06-13 MED ORDER — LIDOCAINE VISCOUS HCL 2 % MT SOLN: 15.0000 mL | OROMUCOSAL | 0 refills | Status: DC | PRN

## 2020-06-13 NOTE — Patient Instructions (Signed)
Fat and Cholesterol Restricted Eating Plan Getting too much fat and cholesterol in your diet may cause health problems. Choosing the right foods helps keep your fat and cholesterol at normal levels. This can keep you from getting certain diseases. Your doctor may recommend an eating plan that includes:  Total fat: ______% or less of total calories a day.  Saturated fat: ______% or less of total calories a day.  Cholesterol: less than _________mg a day.  Fiber: ______g a day. What are tips for following this plan? Meal planning  At meals, divide your plate into four equal parts: ? Fill one-half of your plate with vegetables and green salads. ? Fill one-fourth of your plate with whole grains. ? Fill one-fourth of your plate with low-fat (lean) protein foods.  Eat fish that is high in omega-3 fats at least two times a week. This includes mackerel, tuna, sardines, and salmon.  Eat foods that are high in fiber, such as whole grains, beans, apples, broccoli, carrots, peas, and barley. General tips   Work with your doctor to lose weight if you need to.  Avoid: ? Foods with added sugar. ? Fried foods. ? Foods with partially hydrogenated oils.  Limit alcohol intake to no more than 1 drink a day for nonpregnant women and 2 drinks a day for men. One drink equals 12 oz of beer, 5 oz of wine, or 1 oz of hard liquor. Reading food labels  Check food labels for: ? Trans fats. ? Partially hydrogenated oils. ? Saturated fat (g) in each serving. ? Cholesterol (mg) in each serving. ? Fiber (g) in each serving.  Choose foods with healthy fats, such as: ? Monounsaturated fats. ? Polyunsaturated fats. ? Omega-3 fats.  Choose grain products that have whole grains. Look for the word "whole" as the first word in the ingredient list. Cooking  Cook foods using low-fat methods. These include baking, boiling, grilling, and broiling.  Eat more home-cooked foods. Eat at restaurants and buffets  less often.  Avoid cooking using saturated fats, such as butter, cream, palm oil, palm kernel oil, and coconut oil. Recommended foods  Fruits  All fresh, canned (in natural juice), or frozen fruits. Vegetables  Fresh or frozen vegetables (raw, steamed, roasted, or grilled). Green salads. Grains  Whole grains, such as whole wheat or whole grain breads, crackers, cereals, and pasta. Unsweetened oatmeal, bulgur, barley, quinoa, or brown rice. Corn or whole wheat flour tortillas. Meats and other protein foods  Ground beef (85% or leaner), grass-fed beef, or beef trimmed of fat. Skinless chicken or turkey. Ground chicken or turkey. Pork trimmed of fat. All fish and seafood. Egg whites. Dried beans, peas, or lentils. Unsalted nuts or seeds. Unsalted canned beans. Nut butters without added sugar or oil. Dairy  Low-fat or nonfat dairy products, such as skim or 1% milk, 2% or reduced-fat cheeses, low-fat and fat-free ricotta or cottage cheese, or plain low-fat and nonfat yogurt. Fats and oils  Tub margarine without trans fats. Light or reduced-fat mayonnaise and salad dressings. Avocado. Olive, canola, sesame, or safflower oils. The items listed above may not be a complete list of foods and beverages you can eat. Contact a dietitian for more information. Foods to avoid Fruits  Canned fruit in heavy syrup. Fruit in cream or butter sauce. Fried fruit. Vegetables  Vegetables cooked in cheese, cream, or butter sauce. Fried vegetables. Grains  White bread. White pasta. White rice. Cornbread. Bagels, pastries, and croissants. Crackers and snack foods that contain trans fat   and hydrogenated oils. Meats and other protein foods  Fatty cuts of meat. Ribs, chicken wings, bacon, sausage, bologna, salami, chitterlings, fatback, hot dogs, bratwurst, and packaged lunch meats. Liver and organ meats. Whole eggs and egg yolks. Chicken and turkey with skin. Fried meat. Dairy  Whole or 2% milk, cream,  half-and-half, and cream cheese. Whole milk cheeses. Whole-fat or sweetened yogurt. Full-fat cheeses. Nondairy creamers and whipped toppings. Processed cheese, cheese spreads, and cheese curds. Beverages  Alcohol. Sugar-sweetened drinks such as sodas, lemonade, and fruit drinks. Fats and oils  Butter, stick margarine, lard, shortening, ghee, or bacon fat. Coconut, palm kernel, and palm oils. Sweets and desserts  Corn syrup, sugars, honey, and molasses. Candy. Jam and jelly. Syrup. Sweetened cereals. Cookies, pies, cakes, donuts, muffins, and ice cream. The items listed above may not be a complete list of foods and beverages you should avoid. Contact a dietitian for more information. Summary  Choosing the right foods helps keep your fat and cholesterol at normal levels. This can keep you from getting certain diseases.  At meals, fill one-half of your plate with vegetables and green salads.  Eat high-fiber foods, like whole grains, beans, apples, carrots, peas, and barley.  Limit added sugar, saturated fats, alcohol, and fried foods. This information is not intended to replace advice given to you by your health care provider. Make sure you discuss any questions you have with your health care provider. Document Revised: 08/19/2018 Document Reviewed: 09/02/2017 Elsevier Patient Education  2020 [employer]  

## 2020-06-13 NOTE — Progress Notes (Signed)
Established Patient Office Visit  Subjective:  Patient ID: Patricia Bowers, female    DOB: [birth year]  Age: 29 y.o. MRN: [id number]  CC:  Chief Complaint  Patient presents with   lab followup   Patient consents to telephone visit and 2 patient identifiers was used to identify patient.  HPI Patricia Bowers presents for follow up of IBS, Genella Rife and  lab review. She states that her acid reflux is under control with taking 40 mg Omeprazole daily. She was seen by Gastroenterologist Dr Allegra Lai on 06/02/2020 for IBS and Genella Rife. She was started on Hyoscyamine as needed,and if symptoms persists will try Rifaximin. She states that her abdominal pain has improved 60% since starting Hyoscyamine, but she continues to have 15-20 loose stools daily. Amitriptyline 25 mg at bedtime was also started for insomnia. She states that she continues to experience Insomnia, that sometimes she falls asleep by 5 am and continues to experience difficulty falling and staying asleep. She states that she's experiencing intermittent non productive cough that wakes her up at night and also sore throat. She denies fever, chills, and dysphagia, but admits to experiencing odynophagia. Her Lipid panel done on 06/07/2020, Total cholesterol was 287 mg/dl, Triglyceride 301 mg/dl, HDL 35 mg/dl and LDL 601 mg/dl. She states that she has a family history of hypercholesterolemia. Also her AST was 90 and ALT 134, she denies alcohol consumption and history of Hepatitis. She was seen at the ED for fall and right shoulder pain on 06/01/2020. Imaging to right shoulder showed no acute abnormality and was treated with Tramadol 50 mg every 12 hours prn, and Meloxicam 15 mg daily. She reports that she continues to experience sharp pain with activity and her symptoms has improved 60% since her ED visit. Overall, she states that she's doing well and offers no further complaint.  Past Medical History:  Diagnosis Date   Asthma    IBS (irritable bowel  syndrome)    Kidney stone     Past Surgical History:  Procedure Laterality Date   CHOLECYSTECTOMY     KIDNEY STONE SURGERY     LASIK      Family History  Problem Relation Age of Onset   Breast cancer Mother    Other Father        unknown medical history    Social History   Socioeconomic History   Marital status: Single    Spouse name: Not on file   Number of children: Not on file   Years of education: Not on file   Highest education level: Not on file  Occupational History   Occupation: unemployed  Tobacco Use   Smoking status: Never Smoker   Smokeless tobacco: Never Used  Building services engineer Use: Never used  Substance and Sexual Activity   Alcohol use: Never   Drug use: Never   Sexual activity: Not on file  Other Topics Concern   Not on file  Social History Narrative   Not on file   Social Determinants of Health   Financial Resource Strain:    Difficulty of Paying Living Expenses:   Food Insecurity:    Worried About Programme researcher, broadcasting/film/video in the Last Year:    Barista in the Last Year:   Transportation Needs:    Freight forwarder (Medical):    Lack of Transportation (Non-Medical):   Physical Activity:    Days of Exercise per Week:    Minutes of Exercise per Session:   Stress:  Feeling of Stress :   Social Connections:    Frequency of Communication with Friends and Family:    Frequency of Social Gatherings with Friends and Family:    Attends Religious Services:    Active Member of Clubs or Organizations:    Attends Music therapist:    Marital Status:   Intimate Partner Violence:    Fear of Current or Ex-Partner:    Emotionally Abused:    Physically Abused:    Sexually Abused:     Outpatient Medications Prior to Visit  Medication Sig Dispense Refill   albuterol (VENTOLIN HFA) 108 (90 Base) MCG/ACT inhaler Inhale 2 puffs into the lungs every 6 (six) hours as needed for wheezing or  shortness of breath. 16 g 0   amitriptyline (ELAVIL) 25 MG tablet Take 1 tablet (25 mg total) by mouth at bedtime. 30 tablet 2   Fluticasone-Salmeterol (ADVAIR) 250-50 MCG/DOSE AEPB Inhale 1 puff into the lungs daily. 60 each 1   hyoscyamine (LEVSIN) 0.125 MG tablet Take 1 tablet (0.125 mg total) by mouth every 4 (four) hours as needed. 30 tablet 0   meloxicam (MOBIC) 15 MG tablet Take 1 tablet (15 mg total) by mouth daily. 30 tablet 0   omeprazole (PRILOSEC) 40 MG capsule Take 1 capsule (40 mg total) by mouth daily before breakfast. 30 capsule 2   traMADol (ULTRAM) 50 MG tablet Take 1 tablet (50 mg total) by mouth every 12 (twelve) hours as needed. 10 tablet 0   No facility-administered medications prior to visit.    Allergies  Allergen Reactions   Morphine And Related Other (See Comments)    Dizziness, nausea, nightmares    ROS Review of Systems  Constitutional: Negative.  Negative for chills and fever.  HENT: Positive for sore throat.   Respiratory: Positive for cough.   Cardiovascular: Negative.   Gastrointestinal: Negative for abdominal pain.  Psychiatric/Behavioral: Positive for sleep disturbance.      Objective:    Physical Exam No Physical exam was done LMP 05/20/2020 (Approximate)  Wt Readings from Last 3 Encounters:  06/02/20 263 lb 4 oz (119.4 kg)  06/01/20 265 lb (120.2 kg)  05/25/20 263 lb (119.3 kg)     Health Maintenance Due  Topic Date Due   Hepatitis C Screening  Never done   COVID-19 Vaccine (1) Never done   HIV Screening  Never done   TETANUS/TDAP  Never done   PAP-Cervical Cytology Screening  Never done   PAP SMEAR-Modifier  Never done    There are no preventive care reminders to display for this patient.  Lab Results  Component Value Date   TSH 0.886 06/07/2020   Lab Results  Component Value Date   WBC 6.9 06/07/2020   HGB 13.3 06/07/2020   HCT 39.2 06/07/2020   MCV 84 06/07/2020   PLT 246 06/07/2020   Lab Results   Component Value Date   NA 139 06/07/2020   K 3.9 06/07/2020   CO2 20 06/07/2020   GLUCOSE 107 (H) 06/07/2020   BUN 10 06/07/2020   CREATININE 0.63 06/07/2020   BILITOT 0.4 06/07/2020   ALKPHOS 77 06/07/2020   AST 90 (H) 06/07/2020   ALT 134 (H) 06/07/2020   PROT 6.9 06/07/2020   ALBUMIN 4.1 06/07/2020   CALCIUM 9.6 06/07/2020   Lab Results  Component Value Date   CHOL 287 (H) 06/07/2020   Lab Results  Component Value Date   HDL 35 (L) 06/07/2020   Lab Results  Component  Value Date   LDLCALC 199 (H) 06/07/2020   Lab Results  Component Value Date   TRIG 265 (H) 06/07/2020   Lab Results  Component Value Date   CHOLHDL 8.2 (H) 06/07/2020   No results found for: HGBA1C    Assessment & Plan:     1. Gastroesophageal reflux disease without esophagitis - Her acid reflux is under control and she will continue on current treatment regimen. -Avoid spicy, fatty and fried food -Avoid sodas and sour juices -Avoid heavy meals -Avoid eating 4 hours before bedtime -Elevate head of bed at night    2. Irritable bowel syndrome with diarrhea - She will continue on current treatment regimen and follow up with Gastroenterologist Dr Liz Beach.  3. Elevated lipids - Will repeat fasting Lipid panel and if elevated, will refer to Endocrinologist. She was advised to continue on low fat/low cholesterol diet and exercise as tolerated. - Lipid panel; Future  4. Elevated liver enzymes - Her Liver enzymes are elevated, likely Fatty liver, will check Hepatitis panel and if normal, will refer to Gastroenterologist. - Hepatitis, Acute; Future - Urinalysis; Future  5. Insomnia, unspecified type - She will continue on current treatment regimen and will follow up with Ms. Hilbert Corrigan for evaluation.  6. Sore throat - She will continue on current treatment regimen and was advised to notify clinic with worsening symptoms. - lidocaine (XYLOCAINE) 2 % solution; Use as directed 15 mLs in the  mouth or throat as needed for mouth pain.  Dispense: 100 mL; Refill: 0  7. Cough - She will continue on Benzonatate and advised to notify clinic with worsening symptoms. - benzonatate (TESSALON PERLES) 100 MG capsule; Take 1 capsule (100 mg total) by mouth 2 (two) times daily as needed for cough.  Dispense: 20 capsule; Refill: 0   Follow-up: Return in about 29 days (around 07/12/2020), or if symptoms worsen or fail to improve.    Mena Lienau Trellis Paganini, NP

## 2020-06-14 ENCOUNTER — Other Ambulatory Visit: Payer: Self-pay

## 2020-06-14 ENCOUNTER — Ambulatory Visit: Payer: Self-pay

## 2020-06-14 DIAGNOSIS — R748 Abnormal levels of other serum enzymes: Secondary | ICD-10-CM

## 2020-06-14 DIAGNOSIS — E785 Hyperlipidemia, unspecified: Secondary | ICD-10-CM

## 2020-06-15 ENCOUNTER — Telehealth: Payer: Self-pay

## 2020-06-15 ENCOUNTER — Other Ambulatory Visit: Payer: Self-pay

## 2020-06-15 DIAGNOSIS — R748 Abnormal levels of other serum enzymes: Secondary | ICD-10-CM

## 2020-06-15 LAB — LIPID PANEL
Chol/HDL Ratio: 7.4 ratio — ABNORMAL HIGH (ref 0.0–4.4)
Cholesterol, Total: 260 mg/dL — ABNORMAL HIGH (ref 100–199)
HDL: 35 mg/dL — ABNORMAL LOW (ref >39)
LDL Chol Calc (NIH): 183 mg/dL — ABNORMAL HIGH (ref 0–99)
Triglycerides: 222 mg/dL — ABNORMAL HIGH (ref 0–149)
VLDL Cholesterol Cal: 42 mg/dL — ABNORMAL HIGH (ref 5–40)

## 2020-06-15 LAB — URINALYSIS
Bilirubin, UA: NEGATIVE
Glucose, UA: NEGATIVE
Ketones, UA: NEGATIVE
Leukocytes,UA: NEGATIVE
Nitrite, UA: NEGATIVE
Protein,UA: NEGATIVE
RBC, UA: NEGATIVE
Specific Gravity, UA: 1.005 (ref 1.005–1.030)
Urobilinogen, Ur: 0.2 mg/dL (ref 0.2–1.0)
pH, UA: 6.5 (ref 5.0–7.5)

## 2020-06-15 LAB — HEPATITIS PANEL, ACUTE
Hep A IgM: NEGATIVE
Hep B C IgM: NEGATIVE
Hep C Virus Ab: <0.1 s/co ratio (ref 0.0–0.9)
Hepatitis B Surface Ag: NEGATIVE

## 2020-06-15 NOTE — Telephone Encounter (Signed)
Patient returned call left by [facility] in the morning on 6/17 regarding lab results.  I explained to the patient that her hepatitis lab results were normal and Patricia Bowers is referring her to GI for further evaluation for elevated liver enzymes.  Patient then proceeded to explain about her visit to the office on 6/16. She was very emotionally upset by the way she felt she was treated. I apologized for the situation numerous times. She brought up her experience at [facility] ER that was also very emotionally upsetting to her.   The patient seemed truly distraught by these encounters and said her continued treatment by the medical profession since she doesn't have health insurance makes her feel "like a piece of shit."  I explained to the patient that we have a Child psychotherapist in the office that I feel could help her deal with some of the emotions she is experiencing. She declined at this time.  Within minutes, I received a call from the patients mom. We discussed this situation again. I explained to the mom about the social worker we have on staff that I feel could help her daughter deal with some of the emotions she is experiencing. She declined to make an appt at this time.  Patient and mom both acknowledged the followup appt in July with Patricia Bowers and plan to keep this appt.

## 2020-06-20 ENCOUNTER — Telehealth: Payer: Self-pay | Admitting: Pharmacist

## 2020-06-20 NOTE — Telephone Encounter (Signed)
06/20/2020 12:07:55 PM - Advair 250/50 New med  -- Rhetta Mura - Tuesday, June 20, 2020 12:06 PM --Received pharmacy printout for Advair 250/50 Inhale 1 puff into the lung every day. Printed [employer] application-mailing patient her portion to sign & return, also sending script to [facility] for Graham to sign.

## 2020-06-21 ENCOUNTER — Ambulatory Visit: Payer: Self-pay | Admitting: Gerontology

## 2020-07-12 ENCOUNTER — Other Ambulatory Visit: Payer: Self-pay | Admitting: Gerontology

## 2020-07-12 ENCOUNTER — Ambulatory Visit: Payer: Self-pay | Admitting: Gerontology

## 2020-07-12 ENCOUNTER — Telehealth: Payer: Self-pay | Admitting: "Endocrinology

## 2020-07-12 ENCOUNTER — Encounter: Payer: Self-pay | Admitting: Gerontology

## 2020-07-12 ENCOUNTER — Other Ambulatory Visit: Payer: Self-pay

## 2020-07-12 VITALS — BP 119/85 | HR 92 | Ht 63.0 in | Wt 263.0 lb

## 2020-07-12 DIAGNOSIS — R748 Abnormal levels of other serum enzymes: Secondary | ICD-10-CM

## 2020-07-12 DIAGNOSIS — J029 Acute pharyngitis, unspecified: Secondary | ICD-10-CM

## 2020-07-12 DIAGNOSIS — Z0000 Encounter for general adult medical examination without abnormal findings: Secondary | ICD-10-CM

## 2020-07-12 DIAGNOSIS — E785 Hyperlipidemia, unspecified: Secondary | ICD-10-CM

## 2020-07-12 DIAGNOSIS — H6122 Impacted cerumen, left ear: Secondary | ICD-10-CM

## 2020-07-12 DIAGNOSIS — K219 Gastro-esophageal reflux disease without esophagitis: Secondary | ICD-10-CM

## 2020-07-12 MED ORDER — DEBROX 6.5 % OT SOLN: 5.0000 [drp] | Freq: Two times a day (BID) | OTIC | 0 refills | Status: DC

## 2020-07-12 MED ORDER — OMEPRAZOLE 40 MG PO CPDR: 40.0000 mg | DELAYED_RELEASE_CAPSULE | Freq: Every day | ORAL | 2 refills | Status: DC

## 2020-07-12 NOTE — Patient Instructions (Signed)
Fat and Cholesterol Restricted Eating Plan Getting too much fat and cholesterol in your diet may cause health problems. Choosing the right foods helps keep your fat and cholesterol at normal levels. This can keep you from getting certain diseases. Your doctor may recommend an eating plan that includes:  Total fat: ______% or less of total calories a day.  Saturated fat: ______% or less of total calories a day.  Cholesterol: less than _________mg a day.  Fiber: ______g a day. What are tips for following this plan? Meal planning  At meals, divide your plate into four equal parts: ? Fill one-half of your plate with vegetables and green salads. ? Fill one-fourth of your plate with whole grains. ? Fill one-fourth of your plate with low-fat (lean) protein foods.  Eat fish that is high in omega-3 fats at least two times a week. This includes mackerel, tuna, sardines, and salmon.  Eat foods that are high in fiber, such as whole grains, beans, apples, broccoli, carrots, peas, and barley. General tips   Work with your doctor to lose weight if you need to.  Avoid: ? Foods with added sugar. ? Fried foods. ? Foods with partially hydrogenated oils.  Limit alcohol intake to no more than 1 drink a day for nonpregnant women and 2 drinks a day for men. One drink equals 12 oz of beer, 5 oz of wine, or 1 oz of hard liquor. Reading food labels  Check food labels for: ? Trans fats. ? Partially hydrogenated oils. ? Saturated fat (g) in each serving. ? Cholesterol (mg) in each serving. ? Fiber (g) in each serving.  Choose foods with healthy fats, such as: ? Monounsaturated fats. ? Polyunsaturated fats. ? Omega-3 fats.  Choose grain products that have whole grains. Look for the word "whole" as the first word in the ingredient list. Cooking  Cook foods using low-fat methods. These include baking, boiling, grilling, and broiling.  Eat more home-cooked foods. Eat at restaurants and buffets  less often.  Avoid cooking using saturated fats, such as butter, cream, palm oil, palm kernel oil, and coconut oil. Recommended foods  Fruits  All fresh, canned (in natural juice), or frozen fruits. Vegetables  Fresh or frozen vegetables (raw, steamed, roasted, or grilled). Green salads. Grains  Whole grains, such as whole wheat or whole grain breads, crackers, cereals, and pasta. Unsweetened oatmeal, bulgur, barley, quinoa, or brown rice. Corn or whole wheat flour tortillas. Meats and other protein foods  Ground beef (85% or leaner), grass-fed beef, or beef trimmed of fat. Skinless chicken or turkey. Ground chicken or turkey. Pork trimmed of fat. All fish and seafood. Egg whites. Dried beans, peas, or lentils. Unsalted nuts or seeds. Unsalted canned beans. Nut butters without added sugar or oil. Dairy  Low-fat or nonfat dairy products, such as skim or 1% milk, 2% or reduced-fat cheeses, low-fat and fat-free ricotta or cottage cheese, or plain low-fat and nonfat yogurt. Fats and oils  Tub margarine without trans fats. Light or reduced-fat mayonnaise and salad dressings. Avocado. Olive, canola, sesame, or safflower oils. The items listed above may not be a complete list of foods and beverages you can eat. Contact a dietitian for more information. Foods to avoid Fruits  Canned fruit in heavy syrup. Fruit in cream or butter sauce. Fried fruit. Vegetables  Vegetables cooked in cheese, cream, or butter sauce. Fried vegetables. Grains  White bread. White pasta. White rice. Cornbread. Bagels, pastries, and croissants. Crackers and snack foods that contain trans fat   and hydrogenated oils. Meats and other protein foods  Fatty cuts of meat. Ribs, chicken wings, bacon, sausage, bologna, salami, chitterlings, fatback, hot dogs, bratwurst, and packaged lunch meats. Liver and organ meats. Whole eggs and egg yolks. Chicken and turkey with skin. Fried meat. Dairy  Whole or 2% milk, cream,  half-and-half, and cream cheese. Whole milk cheeses. Whole-fat or sweetened yogurt. Full-fat cheeses. Nondairy creamers and whipped toppings. Processed cheese, cheese spreads, and cheese curds. Beverages  Alcohol. Sugar-sweetened drinks such as sodas, lemonade, and fruit drinks. Fats and oils  Butter, stick margarine, lard, shortening, ghee, or bacon fat. Coconut, palm kernel, and palm oils. Sweets and desserts  Corn syrup, sugars, honey, and molasses. Candy. Jam and jelly. Syrup. Sweetened cereals. Cookies, pies, cakes, donuts, muffins, and ice cream. The items listed above may not be a complete list of foods and beverages you should avoid. Contact a dietitian for more information. Summary  Choosing the right foods helps keep your fat and cholesterol at normal levels. This can keep you from getting certain diseases.  At meals, fill one-half of your plate with vegetables and green salads.  Eat high-fiber foods, like whole grains, beans, apples, carrots, peas, and barley.  Limit added sugar, saturated fats, alcohol, and fried foods. This information is not intended to replace advice given to you by your health care provider. Make sure you discuss any questions you have with your health care provider. Document Revised: 08/19/2018 Document Reviewed: 09/02/2017 Elsevier Patient Education  2020 [employer]  

## 2020-07-12 NOTE — Progress Notes (Signed)
Established Patient Office Visit  Subjective:  Patient ID: Patricia Bowers, female    DOB: 1991/06/17  Age: 29 y.o. MRN: [id number]  CC: No chief complaint on file.   HPI [name] presents for follow up of Hyperlipidemia, elevated liver enzymes and lab review. She states that she continues to experiences odynophagia and taking xylocaine did not relieve symptoms. She also c/o intermittent epigastric pain that has been going on for 2-3 months. She states that she has not taking Omeprazole 40 mg that was prescribed by Gastroenterologist Dr Allegra Lai. She has a history of IBS and reports intermittent abdominal pain, but denies cramping, bloating, and hematochezia. Her Hepatitis panel done on 06/14/2020 was normal and her AST done on 06/07/2020 was 90 and ALT was 134. She denies alcohol intake and jaundice. Her Lipid panel done on 06/14/2020, Total cholesterol decreased from 287 mg/dl, to 073 mg/dl, Triglycerides decreased from 265 mg/dl to 710 mg/dl, Total cholesterol decreased from 199 mg/dl to 626 mg/dl. She also c/o left ear otalgia, denies drainage , rhinorrhea and sinusitis. Overall, she states that she's doing well and offers no further complaint.   Past Medical History:  Diagnosis Date  . Asthma   . IBS (irritable bowel syndrome)   . Kidney stone     Past Surgical History:  Procedure Laterality Date  . CHOLECYSTECTOMY    . KIDNEY STONE SURGERY    . LASIK      Family History  Problem Relation Age of Onset  . Breast cancer Mother   . Other Father        unknown medical history    Social History   Socioeconomic History  . Marital status: Single    Spouse name: Not on file  . Number of children: Not on file  . Years of education: Not on file  . Highest education level: Not on file  Occupational History  . Occupation: unemployed  Tobacco Use  . Smoking status: Never Smoker  . Smokeless tobacco: Never Used  Vaping Use  . Vaping Use: Never used  Substance and Sexual  Activity  . Alcohol use: Never  . Drug use: Never  . Sexual activity: Not on file  Other Topics Concern  . Not on file  Social History Narrative  . Not on file   Social Determinants of Health   Financial Resource Strain:   . Difficulty of Paying Living Expenses:   Food Insecurity:   . Worried About Programme researcher, broadcasting/film/video in the Last Year:   . Barista in the Last Year:   Transportation Needs:   . Freight forwarder (Medical):   Marland Kitchen Lack of Transportation (Non-Medical):   Physical Activity:   . Days of Exercise per Week:   . Minutes of Exercise per Session:   Stress:   . Feeling of Stress :   Social Connections:   . Frequency of Communication with Friends and Family:   . Frequency of Social Gatherings with Friends and Family:   . Attends Religious Services:   . Active Member of Clubs or Organizations:   . Attends Banker Meetings:   Marland Kitchen Marital Status:   Intimate Partner Violence:   . Fear of Current or Ex-Partner:   . Emotionally Abused:   Marland Kitchen Physically Abused:   . Sexually Abused:     Outpatient Medications Prior to Visit  Medication Sig Dispense Refill  . albuterol (VENTOLIN HFA) 108 (90 Base) MCG/ACT inhaler Inhale 2 puffs into the lungs every  6 (six) hours as needed for wheezing or shortness of breath. 16 g 0  . amitriptyline (ELAVIL) 25 MG tablet Take 1 tablet (25 mg total) by mouth at bedtime. 30 tablet 2  . hyoscyamine (LEVSIN) 0.125 MG tablet Take 1 tablet (0.125 mg total) by mouth every 4 (four) hours as needed. 30 tablet 0  . omeprazole (PRILOSEC) 40 MG capsule Take 1 capsule (40 mg total) by mouth daily before breakfast. 30 capsule 2  . benzonatate (TESSALON PERLES) 100 MG capsule Take 1 capsule (100 mg total) by mouth 2 (two) times daily as needed for cough. (Patient not taking: Reported on 07/12/2020) 20 capsule 0  . Fluticasone-Salmeterol (ADVAIR) 250-50 MCG/DOSE AEPB Inhale 1 puff into the lungs daily. (Patient not taking: Reported on  07/12/2020) 60 each 1  . lidocaine (XYLOCAINE) 2 % solution Use as directed 15 mLs in the mouth or throat as needed for mouth pain. (Patient not taking: Reported on 07/12/2020) 100 mL 0  . meloxicam (MOBIC) 15 MG tablet Take 1 tablet (15 mg total) by mouth daily. (Patient not taking: Reported on 07/12/2020) 30 tablet 0  . traMADol (ULTRAM) 50 MG tablet Take 1 tablet (50 mg total) by mouth every 12 (twelve) hours as needed. (Patient not taking: Reported on 07/12/2020) 10 tablet 0   No facility-administered medications prior to visit.    Allergies  Allergen Reactions  . Morphine And Related Other (See Comments)    Dizziness, nausea, nightmares    ROS Review of Systems  Constitutional: Negative.   HENT: Positive for ear pain and postnasal drip.   Respiratory: Negative.   Cardiovascular: Negative.   Gastrointestinal: Positive for abdominal pain.  Neurological: Negative.   Psychiatric/Behavioral: Negative.       Objective:    Physical Exam HENT:     Head: Normocephalic and atraumatic.     Right Ear: Hearing normal. No drainage or tenderness.     Left Ear: Hearing normal. No drainage or tenderness. There is impacted cerumen.  Cardiovascular:     Rate and Rhythm: Normal rate and regular rhythm.     Pulses: Normal pulses.     Heart sounds: Normal heart sounds.  Pulmonary:     Effort: Pulmonary effort is normal.     Breath sounds: Normal breath sounds.  Abdominal:     General: Bowel sounds are normal.  Skin:    General: Skin is warm.  Neurological:     General: No focal deficit present.     Mental Status: She is alert and oriented to person, place, and time. Mental status is at baseline.  Psychiatric:        Mood and Affect: Mood normal.        Behavior: Behavior normal.        Thought Content: Thought content normal.        Judgment: Judgment normal.     BP 119/85 (BP Location: Right Arm, Patient Position: Sitting)   Pulse 92   Ht 5\' 3"  (1.6 m)   Wt 263 lb (119.3 kg)    SpO2 97%   BMI 46.59 kg/m  Wt Readings from Last 3 Encounters:  07/12/20 263 lb (119.3 kg)  06/02/20 263 lb 4 oz (119.4 kg)  06/01/20 265 lb (120.2 kg)   She was encouraged to continue on her weight loss regimen.   Health Maintenance Due  Topic Date Due  . COVID-19 Vaccine (1) Never done  . HIV Screening  Never done  . TETANUS/TDAP  Never done  .  PAP-Cervical Cytology Screening  Never done  . PAP SMEAR-Modifier  Never done    There are no preventive care reminders to display for this patient.  Lab Results  Component Value Date   TSH 0.886 06/07/2020   Lab Results  Component Value Date   WBC 6.9 06/07/2020   HGB 13.3 06/07/2020   HCT 39.2 06/07/2020   MCV 84 06/07/2020   PLT 246 06/07/2020   Lab Results  Component Value Date   NA 139 06/07/2020   K 3.9 06/07/2020   CO2 20 06/07/2020   GLUCOSE 107 (H) 06/07/2020   BUN 10 06/07/2020   CREATININE 0.63 06/07/2020   BILITOT 0.4 06/07/2020   ALKPHOS 77 06/07/2020   AST 90 (H) 06/07/2020   ALT 134 (H) 06/07/2020   PROT 6.9 06/07/2020   ALBUMIN 4.1 06/07/2020   CALCIUM 9.6 06/07/2020   Lab Results  Component Value Date   CHOL 260 (H) 06/14/2020   Lab Results  Component Value Date   HDL 35 (L) 06/14/2020   Lab Results  Component Value Date   LDLCALC 183 (H) 06/14/2020   Lab Results  Component Value Date   TRIG 222 (H) 06/14/2020   Lab Results  Component Value Date   CHOLHDL 7.4 (H) 06/14/2020   No results found for: HGBA1C    Assessment & Plan:    1. Gastroesophageal reflux disease without esophagitis -She declines taking Omeprazole, will follow up with Gastroenterology Dr Liz Beach - omeprazole (PRILOSEC) 40 MG capsule; Take 1 capsule (40 mg total) by mouth daily before breakfast.  Dispense: 30 capsule; Refill: 2  2. Elevated lipids - She was advised to continue on Low fat Diet, like low fat dairy products eg skimmed milk -Avoid any fried food -Regular exercise/walk -Goal for Total Cholesterol  is less than 200 -Goal for bad cholesterol LDL is less than 70 -Goal for Good cholesterol HDL is more than 45 -Goal for Triglyceride is less than 150 - Ambulatory referral to Endocrinology  3. Elevated liver enzymes - Her liver enzymes are elevated possible Fatty liver - Ambulatory referral to Gastroenterology Dr Allegra Lai R  4. Health care maintenance  - HgB A1c; Future - HgB A1c  5. Excessive cerumen in left ear canal  - carbamide peroxide (DEBROX) 6.5 % OTIC solution; Place 5 drops into the left ear 2 (two) times daily.  Dispense: 15 mL; Refill: 0  6. Sore throat - She will follow up with Gastroenterology Dr Liz Beach.    Follow-up: Return in about 9 weeks (around 09/13/2020), or if symptoms worsen or fail to improve.    Shadai Mcclane Trellis Paganini, NP

## 2020-07-12 NOTE — Telephone Encounter (Signed)
Yes

## 2020-07-12 NOTE — Telephone Encounter (Signed)
Dr Fransico Him Received referral on this patient for elevated lipids, would you like me to schedule?

## 2020-07-13 LAB — HEMOGLOBIN A1C
Est. average glucose Bld gHb Est-mCnc: 105 mg/dL
Hgb A1c MFr Bld: 5.3 % (ref 4.8–5.6)

## 2020-07-14 ENCOUNTER — Other Ambulatory Visit: Payer: Self-pay

## 2020-07-14 ENCOUNTER — Telehealth (INDEPENDENT_AMBULATORY_CARE_PROVIDER_SITE_OTHER): Payer: Self-pay | Admitting: Gastroenterology

## 2020-07-14 ENCOUNTER — Encounter: Payer: Self-pay | Admitting: Gastroenterology

## 2020-07-14 DIAGNOSIS — K219 Gastro-esophageal reflux disease without esophagitis: Secondary | ICD-10-CM

## 2020-07-14 DIAGNOSIS — K580 Irritable bowel syndrome with diarrhea: Secondary | ICD-10-CM

## 2020-07-14 DIAGNOSIS — R7989 Other specified abnormal findings of blood chemistry: Secondary | ICD-10-CM

## 2020-07-14 NOTE — Patient Instructions (Addendum)
Your RUQ ultrasound is scheduled for  07/19/2020 [place]. Nothing to eat drink after midnight.

## 2020-07-14 NOTE — Progress Notes (Signed)
Patricia Donath, MD [address]  [address]  [place], [place] [address]  Main: [phone]  Fax: [phone]    Gastroenterology Consultation Tele Visit  Referring Provider:     Rolm Gala, NP Primary Care Physician:  Patient, No Pcp Per Primary Gastroenterologist:  Dr. Arlyss Repress Reason for Consultation:     IBS-D, elevated liver enzymes, chronic GERD        HPI:   Patricia Bowers is a 29 y.o. female referred by Dr. Patient, No Pcp Per  for consultation & management of elevated liver enzymes, IBS-D, chronic GERD  Virtual Visit via Telephone Note  I connected with Patricia Bowers on 07/14/20 at  9:00 AM EDT by telephone and verified that I am speaking with the correct person using two identifiers.   I discussed the limitations, risks, security and privacy concerns of performing an evaluation and management service by telephone and the availability of in person appointments. I also discussed with the patient that there may be a patient responsible charge related to this service. The patient expressed understanding and agreed to proceed.  Location of the Patient: Home  Location of the provider: Office  Persons participating in the visit: Patient and provided only  History of Present Illness: Patricia Bowers originally seen by me in early June for management of chronic GERD and diarrhea predominant IBS.  Her GI symptoms are predominantly secondary to stress therefore, I started her on amitriptyline 25 mg at bedtime.  Patient notices improvement in her symptoms by 30 to 40%.  She is taking omeprazole 40 mg daily before meals which is helping partially with reflux symptoms.  She does notice nocturnal heartburn.  She is not keeping her head of the bed elevated.  She is also concerned about elevated transaminases.  Acute viral hepatitis panel is negative, will she denies any over-the-counter medication or herbal supplements She does not drink alcohol    NSAIDs: None  Antiplts/Anticoagulants/Anti thrombotics: None  GI Procedures: EGD and colonoscopy in 11/05/2017 at [facility] A.  Gastric antrum, biopsy: -Mild reactive gastropathy. -No Helicobacter pylori microorganisms identified on H&E staining.  B.  Distal esophagus, biopsy: -Mucosa of the squamocolumnar junction. -No goblet cell metaplasia identified.  Past Medical History:  Diagnosis Date  . Asthma   . IBS (irritable bowel syndrome)   . Kidney stone     Past Surgical History:  Procedure Laterality Date  . CHOLECYSTECTOMY    . KIDNEY STONE SURGERY    . LASIK      Current Outpatient Medications:  .  albuterol (VENTOLIN HFA) 108 (90 Base) MCG/ACT inhaler, Inhale 2 puffs into the lungs every 6 (six) hours as needed for wheezing or shortness of breath., Disp: 16 g, Rfl: 0 .  amitriptyline (ELAVIL) 25 MG tablet, Take 1 tablet (25 mg total) by mouth at bedtime., Disp: 30 tablet, Rfl: 2 .  hyoscyamine (LEVSIN) 0.125 MG tablet, Take 1 tablet (0.125 mg total) by mouth every 4 (four) hours as needed., Disp: 30 tablet, Rfl: 0 .  omeprazole (PRILOSEC) 40 MG capsule, Take 1 capsule (40 mg total) by mouth daily before breakfast., Disp: 30 capsule, Rfl: 2 .  carbamide peroxide (DEBROX) 6.5 % OTIC solution, Place 5 drops into the left ear 2 (two) times daily. (Patient not taking: Reported on 07/14/2020), Disp: 15 mL, Rfl: 0   Family History  Problem Relation Age of Onset  . Breast cancer Mother   . Other Father        unknown  medical history     Social History   Tobacco Use  . Smoking status: Never Smoker  . Smokeless tobacco: Never Used  Vaping Use  . Vaping Use: Never used  Substance Use Topics  . Alcohol use: Never  . Drug use: Never    Allergies as of 07/14/2020 - Review Complete 07/14/2020  Allergen Reaction Noted  . Morphine and related Other (See Comments) 02/23/2020     Imaging Studies: None  Assessment and Plan:   Patricia Bowers is a 29 y.o. female with  morbid obesity is seen in consultation for management of diarrhea predominant irritable bowel syndrome, chronic GERD and elevated transaminases  Elevated liver 2-3 times upper limit of normal: Likely secondary to fatty liver Acute viral hepatitis panel negative Recommend right upper quadrant ultrasound Recheck LFTs, if improving, I am okay to start low-dose statin given hyperlipidemia  IBS-diarrhea, partially controlled Increase amitriptyline to 50 mg at bedtime Continue hyoscyamine as needed  Chronic GERD Reiterated on antireflux lifestyle Continue omeprazole 40 mg daily before breakfast   Follow Up Instructions:   I discussed the assessment and treatment plan with the patient. The patient was provided an opportunity to ask questions and all were answered. The patient agreed with the plan and demonstrated an understanding of the instructions.   The patient was advised to call back or seek an in-person evaluation if the symptoms worsen or if the condition fails to improve as anticipated.  I provided 16 minutes of non-face-to-face time during this encounter.   Follow up in 2 to 3 months in office.   Arlyss Repress, MD

## 2020-07-19 ENCOUNTER — Ambulatory Visit
Admission: RE | Admit: 2020-07-19 | Discharge: 2020-07-19 | Disposition: A | Discharge status: Home / Self Care | Payer: Self-pay | Source: Ambulatory Visit | Attending: Gastroenterology | Admitting: Gastroenterology

## 2020-07-19 ENCOUNTER — Other Ambulatory Visit: Payer: Self-pay

## 2020-07-19 DIAGNOSIS — R7989 Other specified abnormal findings of blood chemistry: Secondary | ICD-10-CM | POA: Insufficient documentation

## 2020-07-21 ENCOUNTER — Encounter: Payer: Self-pay | Admitting: Gastroenterology

## 2020-07-21 IMAGING — CR DG SHOULDER 2+V*R*
4 series · 4 of 4 positions shown · non-contrast
Comparison: None.

CLINICAL DATA: Fall yesterday with shoulder pain, initial encounter

EXAM:
RIGHT SHOULDER - 2+ VIEW

[shoulder grashey]
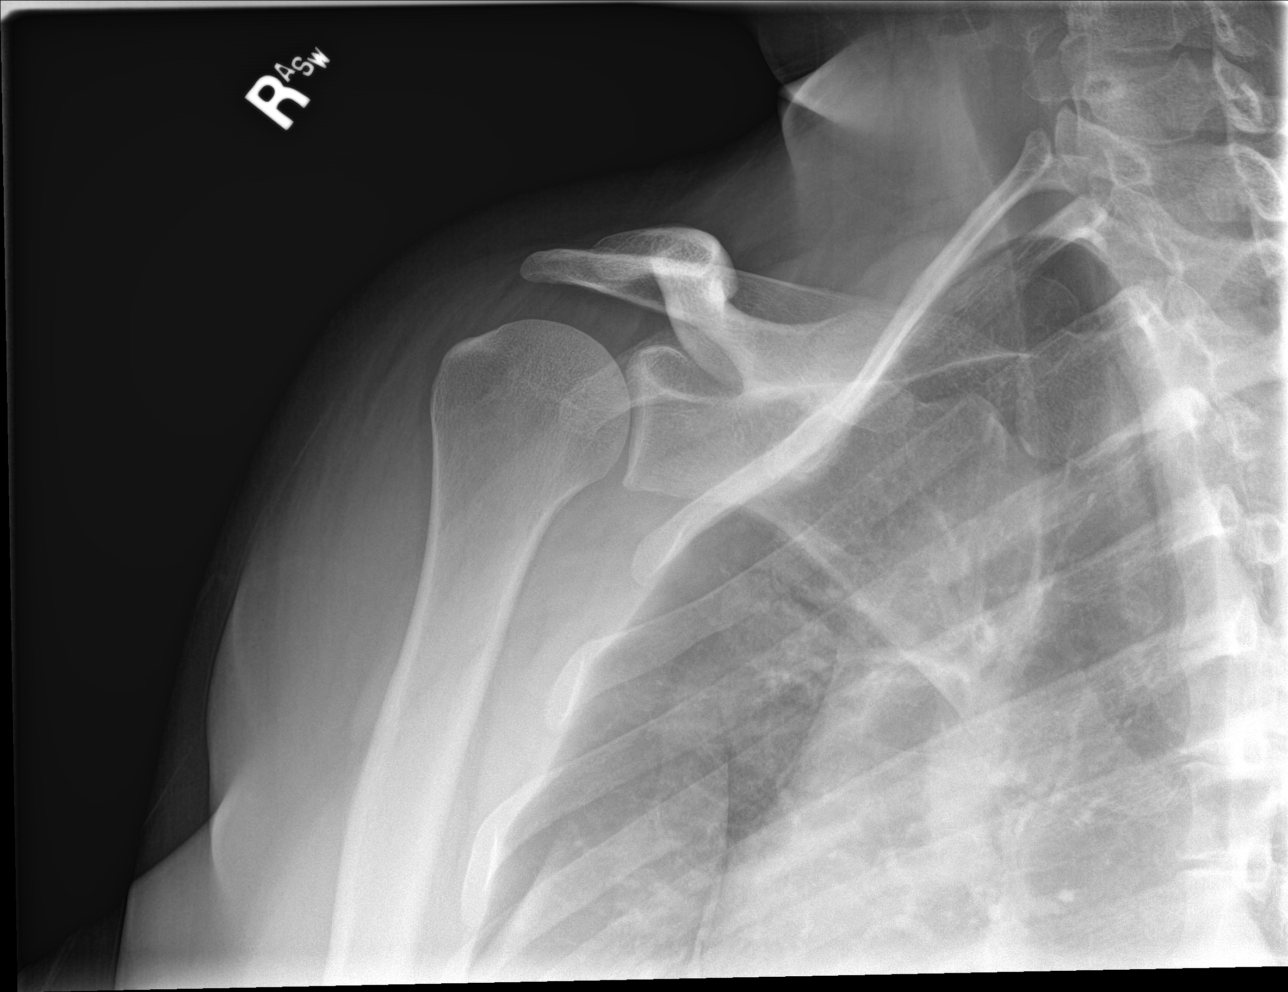

[shoulder y view (1 of 2)]
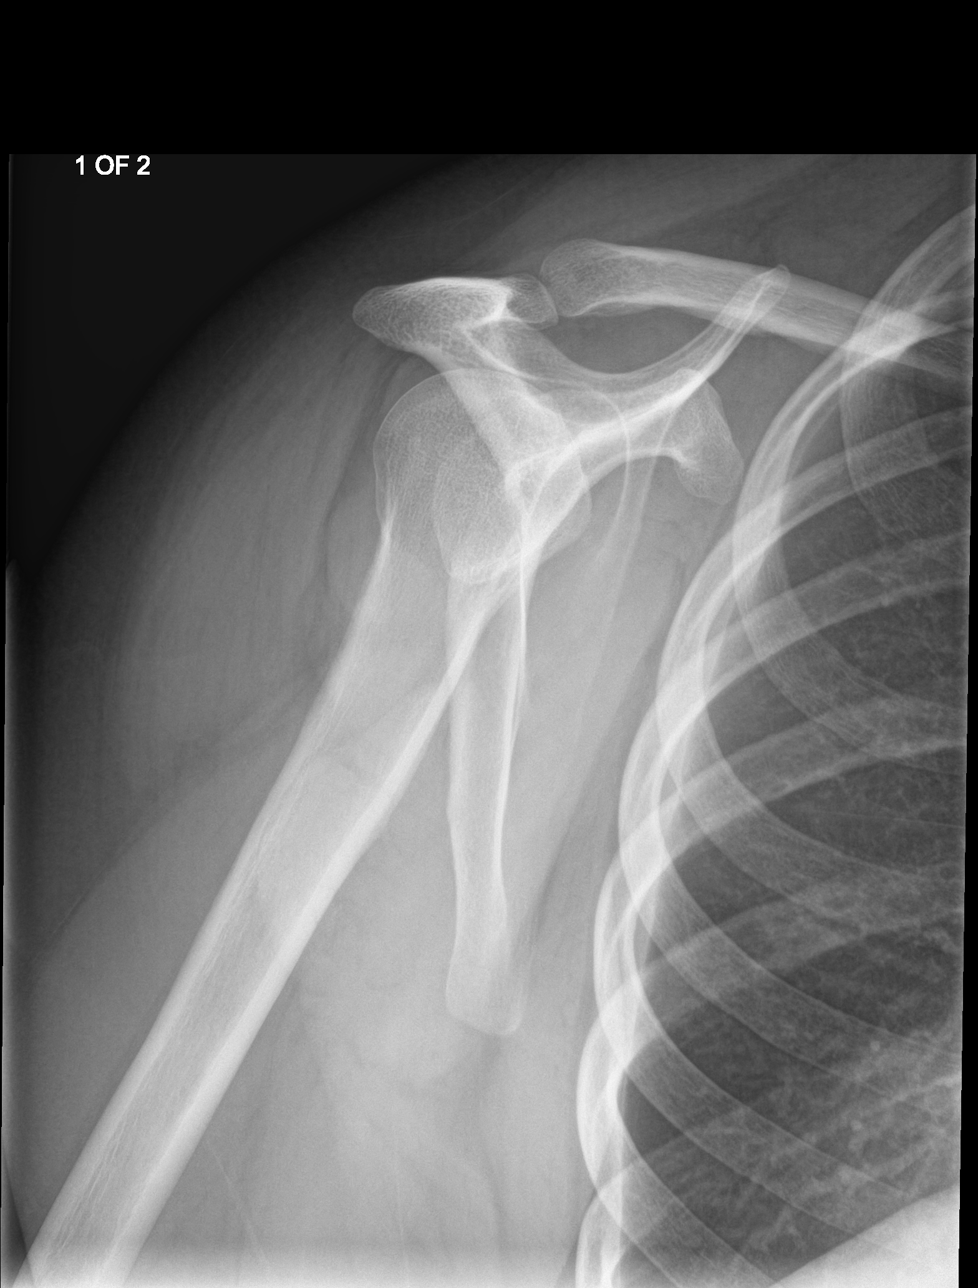

[shoulder axial]
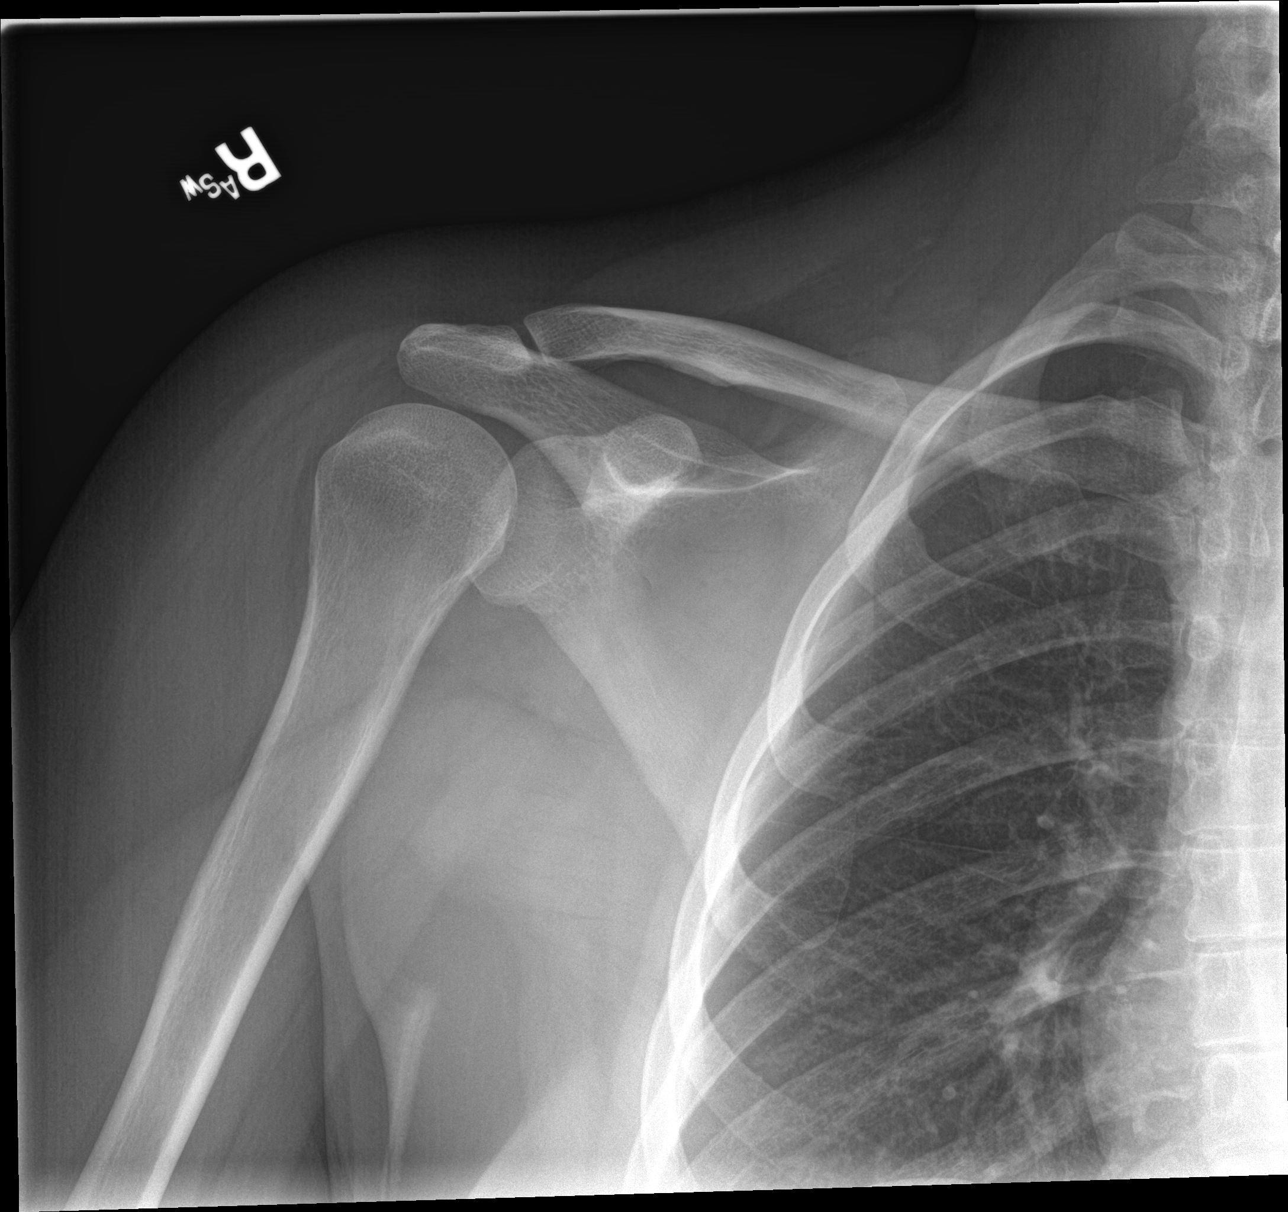

[shoulder y view (2 of 2)]
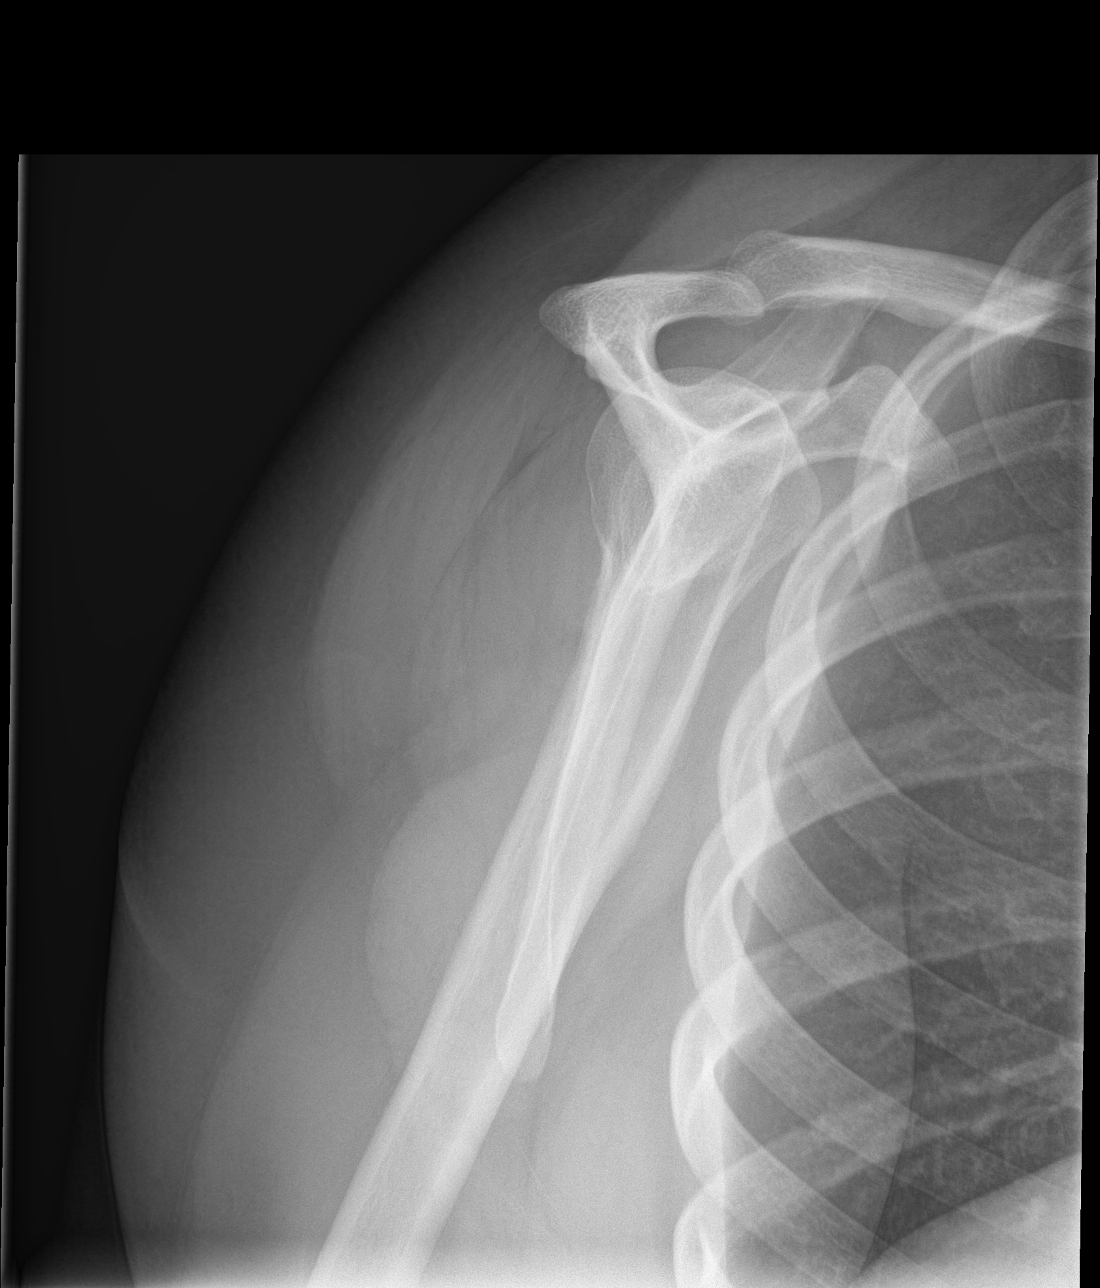

[4 of 4 positions shown; findings below may reference images not displayed]

FINDINGS: There is no evidence of fracture or dislocation. There is no
evidence of arthropathy or other focal bone abnormality. Soft
tissues are unremarkable.
IMPRESSION: No acute abnormality noted.

## 2020-07-26 ENCOUNTER — Other Ambulatory Visit: Payer: Self-pay

## 2020-07-26 ENCOUNTER — Other Ambulatory Visit
Admission: RE | Admit: 2020-07-26 | Discharge: 2020-07-26 | Disposition: A | Discharge status: Home / Self Care | Payer: Self-pay | Attending: Gastroenterology | Admitting: Gastroenterology

## 2020-07-26 DIAGNOSIS — K760 Fatty (change of) liver, not elsewhere classified: Secondary | ICD-10-CM | POA: Insufficient documentation

## 2020-07-26 LAB — HEPATIC FUNCTION PANEL
ALT: 145 U/L — ABNORMAL HIGH (ref 0–44)
AST: 83 U/L — ABNORMAL HIGH (ref 15–41)
Albumin: 4.2 g/dL (ref 3.5–5.0)
Alkaline Phosphatase: 63 U/L (ref 38–126)
Bilirubin, Direct: <0.1 mg/dL (ref 0.0–0.2)
Total Bilirubin: 0.8 mg/dL (ref 0.3–1.2)
Total Protein: 7.4 g/dL (ref 6.5–8.1)

## 2020-08-01 ENCOUNTER — Telehealth: Payer: Self-pay

## 2020-08-01 DIAGNOSIS — K760 Fatty (change of) liver, not elsewhere classified: Secondary | ICD-10-CM

## 2020-08-01 NOTE — Telephone Encounter (Signed)
-----   Message from Toney Reil, MD sent at 07/31/2020 10:39 PM EDT ----- Recheck LFTs in 45months Dx: Fatty liver  RV

## 2020-08-01 NOTE — Telephone Encounter (Signed)
Order labs for 3 months

## 2020-08-08 ENCOUNTER — Telehealth: Payer: Self-pay | Admitting: *Deleted

## 2020-08-08 NOTE — Telephone Encounter (Signed)
Request from [facility] to reschedule 9/15 appts... left VM for patient to call back to do so

## 2020-08-11 ENCOUNTER — Telehealth: Payer: Self-pay | Admitting: Gastroenterology

## 2020-08-22 ENCOUNTER — Encounter: Payer: Self-pay | Admitting: "Endocrinology

## 2020-08-22 ENCOUNTER — Other Ambulatory Visit: Payer: Self-pay

## 2020-08-22 ENCOUNTER — Encounter: Payer: Self-pay | Attending: "Endocrinology | Admitting: Nutrition

## 2020-08-22 ENCOUNTER — Ambulatory Visit (INDEPENDENT_AMBULATORY_CARE_PROVIDER_SITE_OTHER): Payer: Self-pay | Admitting: "Endocrinology

## 2020-08-22 VITALS — BP 110/72 | HR 84 | Ht 63.0 in | Wt 265.6 lb

## 2020-08-22 DIAGNOSIS — E282 Polycystic ovarian syndrome: Secondary | ICD-10-CM | POA: Insufficient documentation

## 2020-08-22 DIAGNOSIS — E782 Mixed hyperlipidemia: Secondary | ICD-10-CM | POA: Insufficient documentation

## 2020-08-22 DIAGNOSIS — Z8719 Personal history of other diseases of the digestive system: Secondary | ICD-10-CM | POA: Insufficient documentation

## 2020-08-22 MED ORDER — ROSUVASTATIN CALCIUM 10 MG PO TABS: 10.0000 mg | ORAL_TABLET | Freq: Every day | ORAL | 1 refills | Status: DC

## 2020-08-22 NOTE — Patient Instructions (Signed)

## 2020-08-22 NOTE — Patient Instructions (Addendum)
Goals  Follow My Plate Execise 30 minutes a day Eat meals on schedule B) 6-8, L) 12-2 D) 5-7 Drink only water-gallon a day. Avoid gluten foods Lose 1-2 lbs per week Follow Low Cholesterol Diet. Keep a food journal.

## 2020-08-22 NOTE — Progress Notes (Signed)
Medical Nutrition Therapy:  Appt start time: 1530 end time:  1600.  Assessment:  Primary concerns today: Hyperlipidemia and morbid obesity. BMI 47. . Walk in from Dr. Fransico Him. Her mom is here with her. She denies eating processed food and junk food. PCP Corrie Dandy, NP. Has IBS and GI issues for awhile. Eats randomly and inconsistent due to GI issues.  Not able to eat certain foods due to that. Doesn't feel good at times and can't exercise. Not in school or working right now. Does like to get in kitchen and fix foods at time. Trying to follow gluten free diet but is inconsistent.   Will start Crestor today for hyperlipidemia.  CMP Latest Ref Rng & Units 07/26/2020 06/07/2020  Glucose 65 - 99 mg/dL - 914(N)  BUN 6 - 20 mg/dL - 10  Creatinine 8.29 - 1.00 mg/dL - 5.62  Sodium 130 - 865 mmol/L - 139  Potassium 3.5 - 5.2 mmol/L - 3.9  Chloride 96 - 106 mmol/L - 105  CO2 20 - 29 mmol/L - 20  Calcium 8.7 - 10.2 mg/dL - 9.6  Total Protein 6.5 - 8.1 g/dL 7.4 6.9  Total Bilirubin 0.3 - 1.2 mg/dL 0.8 0.4  Alkaline Phos 38 - 126 U/L 63 77  AST 15 - 41 U/L 83(H) 90(H)  ALT 0 - 44 U/L 145(H) 134(H)   Lab Results  Component Value Date   HGBA1C 5.3 07/12/2020   Lipid Panel     Component Value Date/Time   CHOL 260 (H) 06/14/2020 1144   TRIG 222 (H) 06/14/2020 1144   HDL 35 (L) 06/14/2020 1144   CHOLHDL 7.4 (H) 06/14/2020 1144   LDLCALC 183 (H) 06/14/2020 1144   LABVLDL 42 (H) 06/14/2020 1144   Wt Readings from Last 3 Encounters:  08/22/20 265 lb 9.6 oz (120.5 kg)  07/12/20 263 lb (119.3 kg)  06/02/20 263 lb 4 oz (119.4 kg)   Ht Readings from Last 3 Encounters:  08/22/20 5\' 3"  (1.6 m)  07/12/20 5\' 3"  (1.6 m)  06/02/20 5\' 3"  (1.6 m)   There is no height or weight on file to calculate BMI. @BMIFA @ Facility age limit for growth percentiles is 20 years. Facility age limit for growth percentiles is 20 years.  Preferred Learning Style:   No preference indicated   Learning Readiness:    Ready  Change in progress   MEDICATIONS:    DIETARY INTAKE:   24-hr recall:  Smootie with sherbet with yogurt; kiwi and  L) Skipped; Sanwich or salad, D) Pancakes-3 and  2 eggs. Kup, syrup. Usual physical activity: ADL  Estimated energy needs: 1200  calories 130 g carbohydrates 90 g protein 33 g fat  Progress Towards Goal(s):  In progress.   Nutritional Diagnosis:  NI-1.5 Excessive energy intake As related to Morbid Obesity and Hyperlipidemia.  As evidenced by BMI 47 and     Intervention:  My Plate, Meal planning, Hyperlipidemia, portion sizes, Gluten Free diet, consistency and timing of meals and benefits of high fiber foods.  Need for exercise 60 minutes 4 days per week Hydration and water intake. Avoiding processed foods and artificial sweeteners.  Goals  Follow My Plate Execise 30 minutes a day Eat meals on schedule B) 6-8, L) 12-2 D) 5-7 Drink only water-gallon a day. Avoid gluten foods Lose 1-2 lbs per week Follow Low Cholesterol Diet. Keep a food journal.   Teaching Method Utilized:  Visual Auditory Hands on  Handouts given during visit include:  The Plate Method  Meal Plan Card  Gluten Free Diet  Low Cholesterol foods.  Barriers to learning/adherence to lifestyle change: none  Demonstrated degree of understanding via:  Teach Back   Monitoring/Evaluation:  Dietary intake, exercise, , and body weight in 1 month(s).

## 2020-08-22 NOTE — Progress Notes (Signed)
Endocrinology Consult Note                                            08/22/2020, 3:48 PM   Subjective:    Patient ID: Patricia Bowers, female    DOB: Mar 24, 1991, PCP System, Provider Not In   Past Medical History:  Diagnosis Date  . Asthma   . IBS (irritable bowel syndrome)   . Kidney stone    Past Surgical History:  Procedure Laterality Date  . CHOLECYSTECTOMY    . KIDNEY STONE SURGERY    . LASIK     Social History   Socioeconomic History  . Marital status: Single    Spouse name: Not on file  . Number of children: Not on file  . Years of education: Not on file  . Highest education level: Not on file  Occupational History  . Occupation: unemployed  Tobacco Use  . Smoking status: Never Smoker  . Smokeless tobacco: Never Used  Vaping Use  . Vaping Use: Never used  Substance and Sexual Activity  . Alcohol use: Never  . Drug use: Never  . Sexual activity: Not on file  Other Topics Concern  . Not on file  Social History Narrative  . Not on file   Social Determinants of Health   Financial Resource Strain:   . Difficulty of Paying Living Expenses: Not on file  Food Insecurity:   . Worried About Programme researcher, broadcasting/film/video in the Last Year: Not on file  . Ran Out of Food in the Last Year: Not on file  Transportation Needs:   . Lack of Transportation (Medical): Not on file  . Lack of Transportation (Non-Medical): Not on file  Physical Activity:   . Days of Exercise per Week: Not on file  . Minutes of Exercise per Session: Not on file  Stress:   . Feeling of Stress : Not on file  Social Connections:   . Frequency of Communication with Friends and Family: Not on file  . Frequency of Social Gatherings with Friends and Family: Not on file  . Attends Religious Services: Not on file  . Active Member of Clubs or Organizations: Not on file  . Attends Banker Meetings: Not on file  . Marital Status: Not on file   Family History  Problem Relation  Age of Onset  . Breast cancer Mother   . Cancer Mother   . Other Father        unknown medical history   Outpatient Encounter Medications as of 08/22/2020  Medication Sig  . albuterol (VENTOLIN HFA) 108 (90 Base) MCG/ACT inhaler Inhale 2 puffs into the lungs every 6 (six) hours as needed for wheezing or shortness of breath.  Marland Kitchen amitriptyline (ELAVIL) 25 MG tablet Take 1 tablet (25 mg total) by mouth at bedtime.  . hyoscyamine (LEVSIN) 0.125 MG tablet Take 1 tablet (0.125 mg total) by mouth every 4 (four) hours as needed.  Marland Kitchen omeprazole (PRILOSEC) 40 MG capsule Take 1 capsule (40 mg total) by mouth daily before breakfast.  . rosuvastatin (CRESTOR) 10 MG tablet Take 1 tablet (10 mg total) by mouth daily.  . [DISCONTINUED] carbamide peroxide (DEBROX) 6.5 % OTIC solution Place 5 drops into the left ear 2 (two) times daily. (Patient not taking: Reported on 07/14/2020)   No facility-administered encounter medications on file  as of 08/22/2020.   ALLERGIES: Allergies  Allergen Reactions  . Morphine And Related Other (See Comments)    Dizziness, nausea, nightmares    VACCINATION STATUS: Immunization History  Administered Date(s) Administered  . Influenza-Unspecified 09/05/2016    Patricia Bowers is 29 y.o. female who presents today with a medical history as above. she is being seen in consultation for hyperlipidemia requested by  Eulogio Bear,  NP Patient was diagnosed with hyperlipidemia a few years back.  She is not on any management.  Reportedly, she made some changes in her lifestyle, however her most recent lipid panel is still significantly above target including LDL 183, triglycerides 222.  Is also complicated by transaminases. She does not have diabetes.  She has family history of high cholesterol in her mother.  She has not seen a dietitian yet, she is not following a low-fat diet at this point.  She denies coronary artery disease, CVA, nor PAD.  She a non-smoker.   Review of  Systems  Constitutional: + Progressive weight gain ,  no fatigue, no subjective hyperthermia, no subjective hypothermia Eyes: no blurry vision, no xerophthalmia ENT: no sore throat, no nodules palpated in throat, no dysphagia/odynophagia, no hoarseness Cardiovascular: no Chest Pain, no Shortness of Breath, no palpitations, no leg swelling Respiratory: no cough, no shortness of breath Gastrointestinal: no Nausea/Vomiting/Diarhhea Musculoskeletal: no muscle/joint aches Skin: no rashes Neurological: no tremors, no numbness, no tingling, no dizziness Psychiatric: no depression, no anxiety  Objective:    Vitals with BMI 08/22/2020 07/12/2020 06/02/2020  Height 5\' 3"  5\' 3"  5\' 3"   Weight 265 lbs 10 oz 263 lbs 263 lbs 4 oz  BMI 47.06 46.6 46.64  Systolic 110 119  Diastolic 72 85 96  Pulse 84 92 90    BP 110/72   Pulse 84   Ht 5\' 3"  (1.6 m)   Wt 265 lb 9.6 oz (120.5 kg)   BMI 47.05 kg/m   Wt Readings from Last 3 Encounters:  08/22/20 265 lb 9.6 oz (120.5 kg)  07/12/20 263 lb (119.3 kg)  06/02/20 263 lb 4 oz (119.4 kg)    Physical Exam  Constitutional:  Body mass index is 47.05 kg/m. , not in acute distress, normal state of mind Eyes:  EOMI, no exophthalmos Neck: Supple Respiratory: Adequate breathing efforts Musculoskeletal: no gross deformities, strength intact in all four extremities, no gross restriction of joint movements Skin:  no rashes, no hyperemia Neurological: no tremor with outstretched hands   CMP     Component Value Date/Time   NA 139 06/07/2020 1145   K 3.9 06/07/2020 1145   CL 105 06/07/2020 1145   CO2 20 06/07/2020 1145   GLUCOSE 107 (H) 06/07/2020 1145   BUN 10 06/07/2020 1145   CREATININE 0.63 06/07/2020 1145   CALCIUM 9.6 06/07/2020 1145   PROT 7.4 07/26/2020 1222   PROT 6.9 06/07/2020 1145   ALBUMIN 4.2 07/26/2020 1222   ALBUMIN 4.1 06/07/2020 1145   AST 83 (H) 07/26/2020 1222   ALT 145 (H) 07/26/2020 1222   ALKPHOS 63 07/26/2020 1222    BILITOT 0.8 07/26/2020 1222   BILITOT 0.4 06/07/2020 1145   GFRNONAA 122 06/07/2020 1145   GFRAA 141 06/07/2020 1145     Diabetic Labs (most recent): Lab Results  Component Value Date   HGBA1C 5.3 07/12/2020     Lipid Panel ( most recent) Lipid Panel     Component Value Date/Time   CHOL 260 (H) 06/14/2020 1144   TRIG 222 (  H) 06/14/2020 1144   HDL 35 (L) 06/14/2020 1144   CHOLHDL 7.4 (H) 06/14/2020 1144   LDLCALC 183 (H) 06/14/2020 1144   LABVLDL 42 (H) 06/14/2020 1144      Lab Results  Component Value Date   TSH 0.886 06/07/2020      Assessment & Plan:   1. Mixed hyperlipidemia  2.  Morbid obesity - Patricia Bowers  is being seen at a kind request of  Eulogio Bear, NP. I have  reviewed her available lipid records and clinically evaluated the patient. - Based on these reviews, she has mixed hyperlipidemia. The single most important intervention will be weight loss. She will be sent to dietitian today.  - she  admits there is a room for improvement in her diet and drink choices. -  Suggestion is made for her to avoid simple carbohydrates  from her diet including Cakes, Sweet Desserts / Pastries, Ice Cream, Soda (diet and regular), Sweet Tea, Candies, Chips, Cookies, Sweet Pastries,  Store Bought Juices, Alcohol in Excess of  1-2 drinks a day, Artificial Sweeteners, Coffee Creamer, and "Sugar-free" Products. This will help patient to have stable blood glucose profile and potentially avoid unintended weight gain.  -Given her high risk for  cardiovascular complication of hyperlipidemia, she will benefit from low-dose statins.  I discussed initiated Crestor 10 mg p.o. nightly, side effects and precautions discussed with her.  In light of her transaminases, she will need a repeat CMP before next visit.  -She does not have diabetes, recent A1c of 5.3%.  She is advised to start taking vitamin D3 2000 units daily.  - she is advised to maintain close follow up with her  PCP  for primary care needs.   - Time spent with the patient: 45 minutes, of which >50% was spent in  counseling her about her hyperlipidemia, obesity and the rest in obtaining information about her symptoms, reviewing her previous labs/studies ( including abstractions from other facilities),  evaluations, and treatments,  and developing a plan to confirm diagnosis and long term treatment based on the latest standards of care/guidelines; and documenting her care.  Patricia Bowers participated in the discussions, expressed understanding, and voiced agreement with the above plans.  All questions were answered to her satisfaction. she is encouraged to contact clinic should she have any questions or concerns prior to her return visit.  Follow up plan: Return in about 6 months (around 02/22/2021) for F/U with Pre-visit Labs.   Marquis Lunch, MD [facility] [address] [place], [place] [address] Phone: [phone]  Fax: [phone]     08/22/2020, 3:48 PM  This note was partially dictated with voice recognition software. Similar sounding words can be transcribed inadequately or may not  be corrected upon review.

## 2020-08-24 ENCOUNTER — Encounter: Payer: Self-pay | Admitting: Nutrition

## 2020-08-25 ENCOUNTER — Telehealth: Payer: Self-pay | Admitting: Gastroenterology

## 2020-09-05 ENCOUNTER — Ambulatory Visit: Payer: Self-pay | Admitting: Gerontology

## 2020-09-07 IMAGING — US US ABDOMEN LIMITED
1 series · 14 of 25 positions shown · non-contrast
Comparison: None.

CLINICAL DATA: Right upper quadrant pain

EXAM:
ULTRASOUND ABDOMEN LIMITED RIGHT UPPER QUADRANT

[Series 1: us abdomen limited ruq · 14 of 34 slices shown]
[im 1/34]
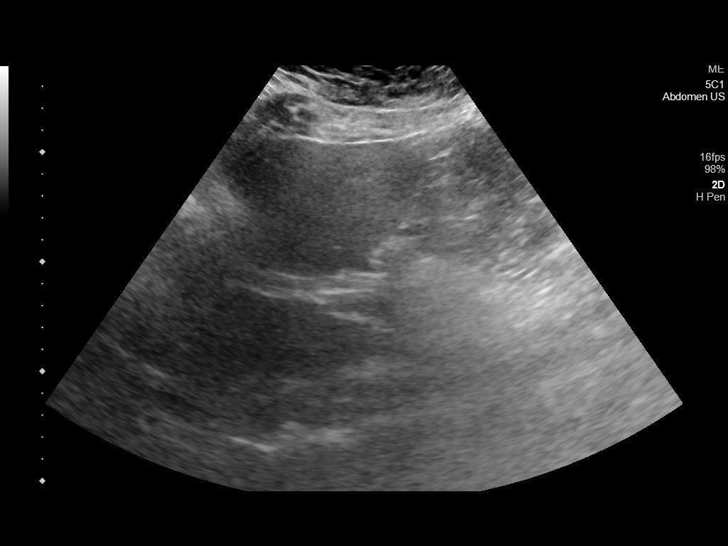
[im 3/34]
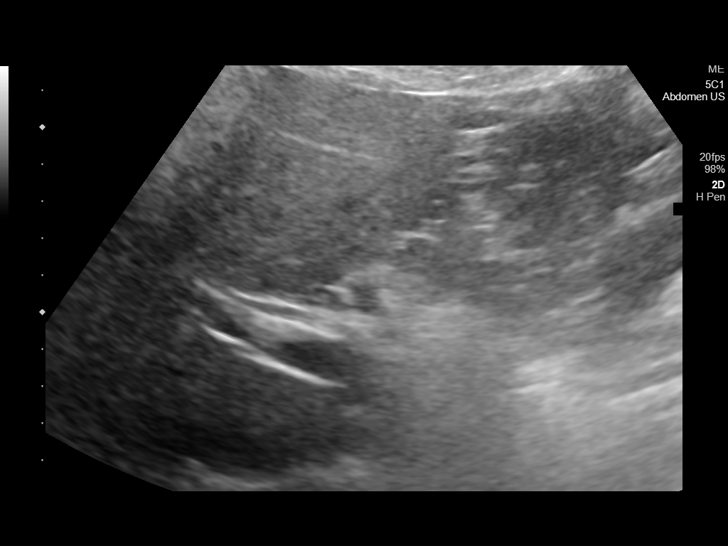
[im 6/34]
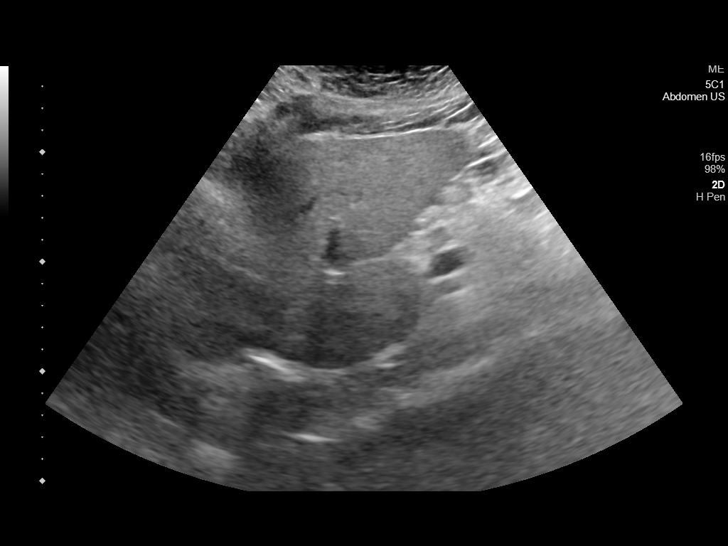
[im 9/34]
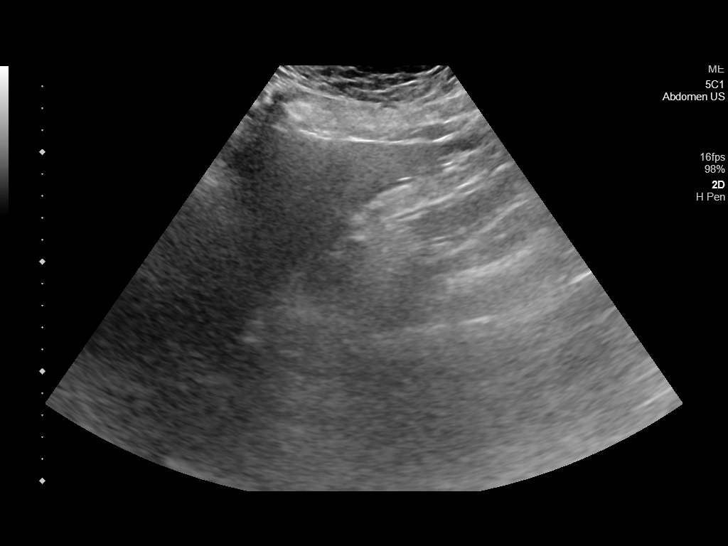
[im 12/34]
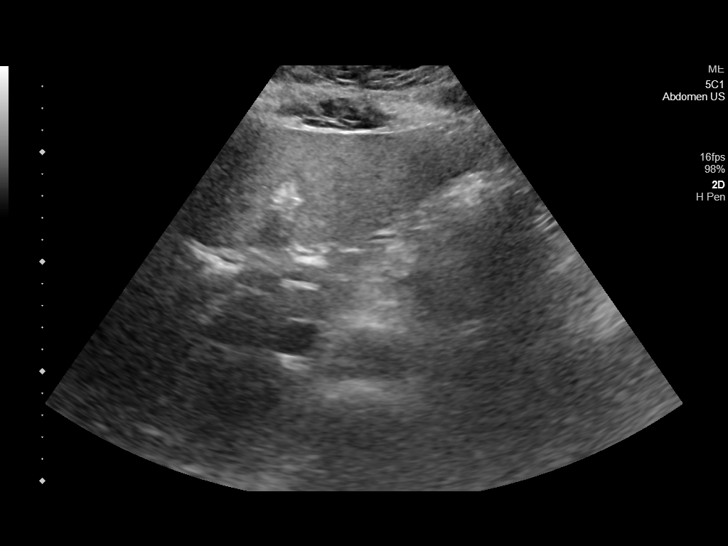
[im 13/34]
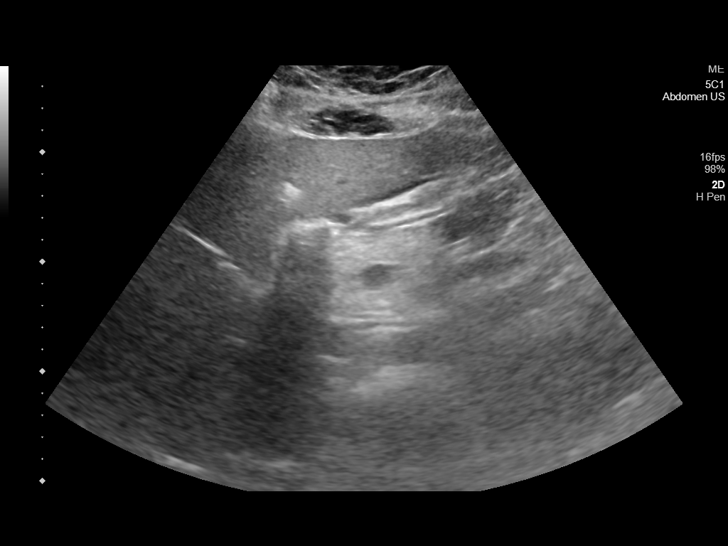
[im 16/34]
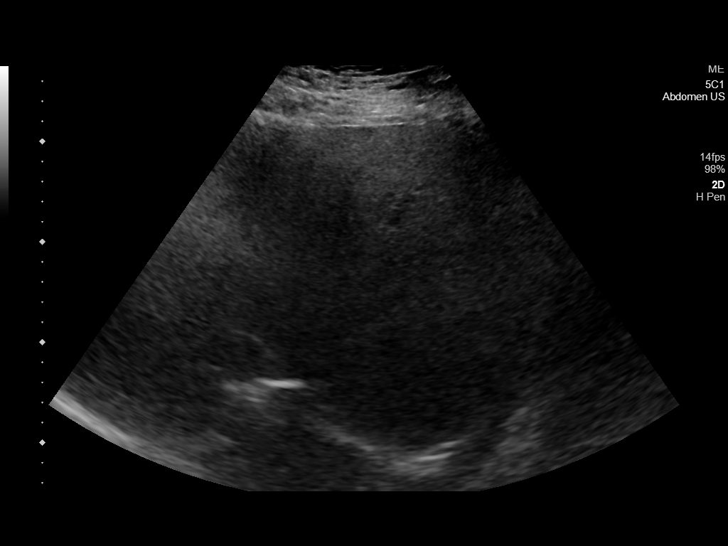
[im 18/34]
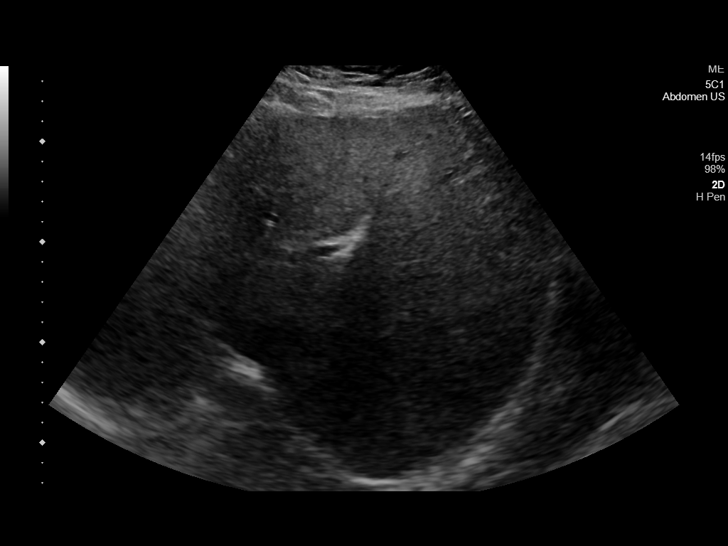
[im 21/34]
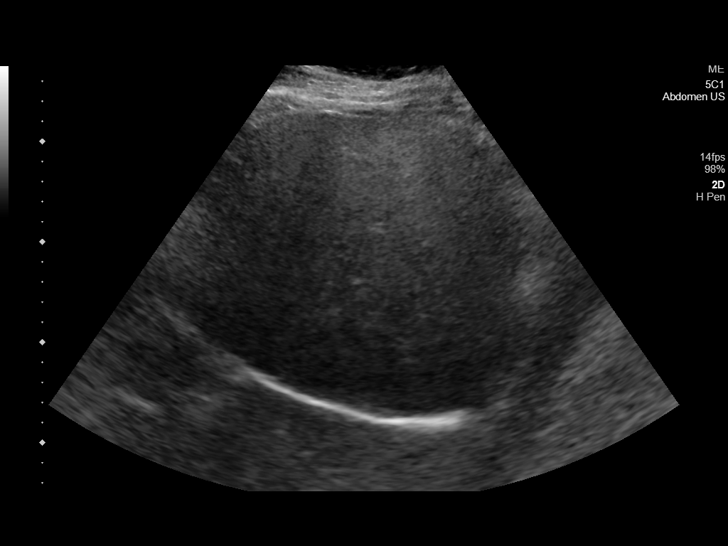
[im 23/34]
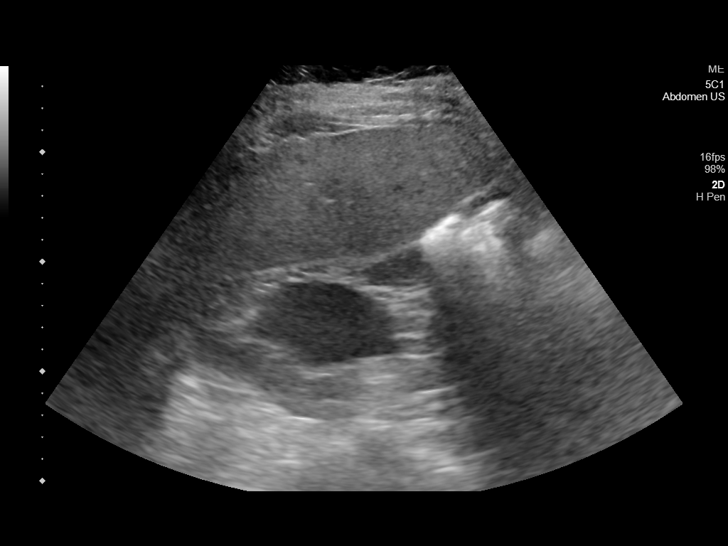
[im 25/34]
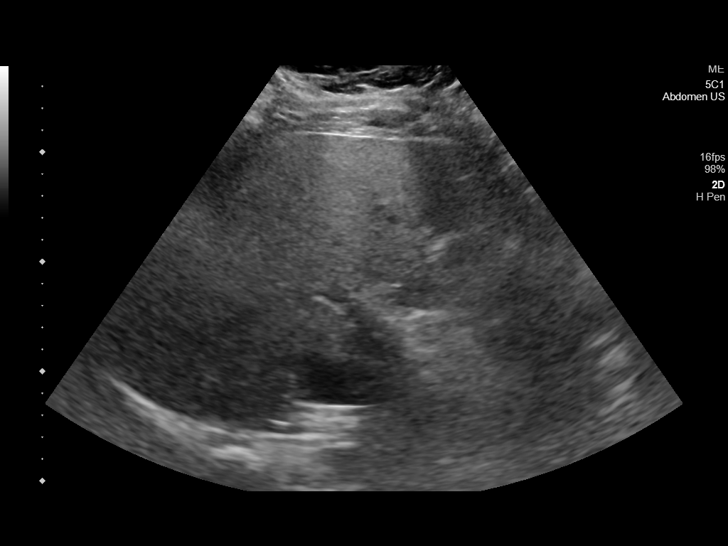
[im 28/34]
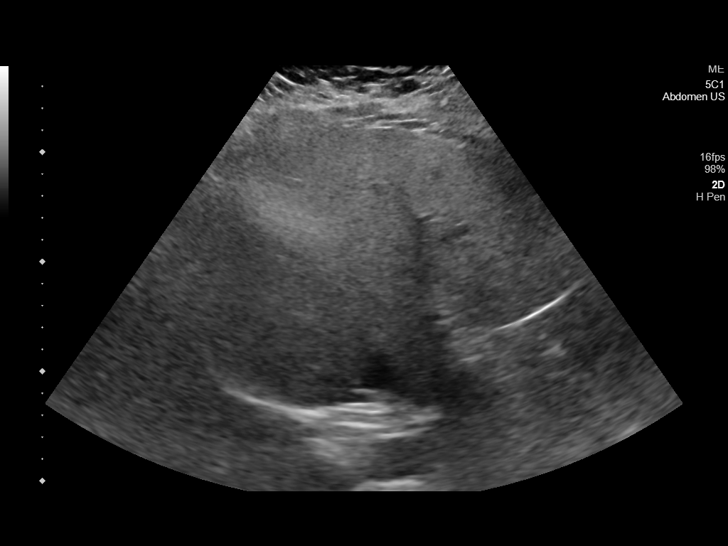
[im 31/34]
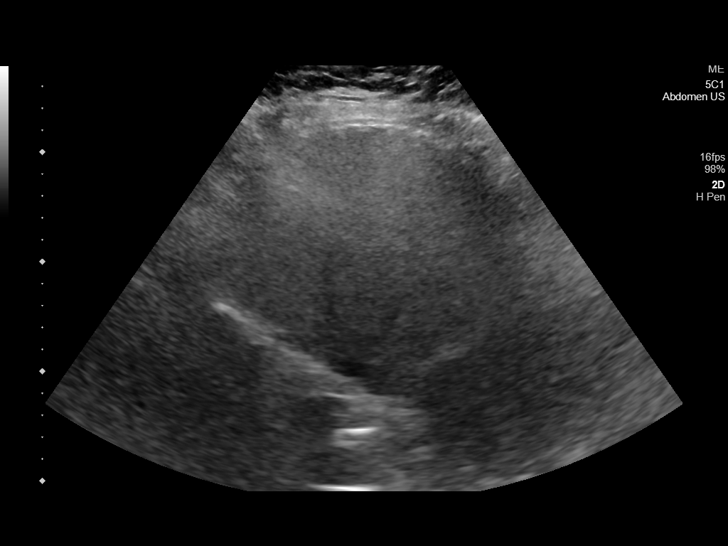
[im 34/34]
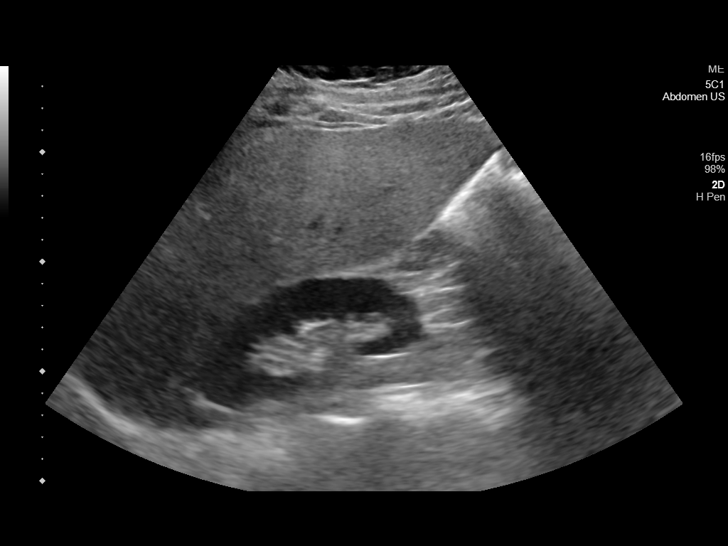

[14 of 25 positions shown; findings below may reference images not displayed]

FINDINGS: Gallbladder:

Surgically absent

Common bile duct:

Diameter: 3.2 mm

Liver:

Liver is echogenic. No focal hepatic abnormality. Portal vein is
patent on color Doppler imaging with normal direction of blood flow
towards the liver.

Other: None.
IMPRESSION: 1. Status post cholecystectomy
2. Echogenic liver consistent with steatosis

## 2020-09-13 ENCOUNTER — Ambulatory Visit: Payer: Self-pay | Admitting: Gerontology

## 2020-09-19 ENCOUNTER — Other Ambulatory Visit: Payer: Self-pay

## 2020-09-19 ENCOUNTER — Ambulatory Visit: Payer: Self-pay | Admitting: Gerontology

## 2020-09-27 ENCOUNTER — Other Ambulatory Visit: Payer: Self-pay

## 2020-09-27 ENCOUNTER — Ambulatory Visit: Payer: Self-pay | Admitting: Gerontology

## 2020-09-27 ENCOUNTER — Encounter: Payer: Self-pay | Attending: "Endocrinology | Admitting: Nutrition

## 2020-09-27 ENCOUNTER — Encounter: Payer: Self-pay | Admitting: Nutrition

## 2020-09-27 VITALS — BP 123/84 | HR 71 | Resp 16 | Wt 261.4 lb

## 2020-09-27 VITALS — Ht 63.0 in | Wt 260.0 lb

## 2020-09-27 DIAGNOSIS — E282 Polycystic ovarian syndrome: Secondary | ICD-10-CM | POA: Insufficient documentation

## 2020-09-27 DIAGNOSIS — E782 Mixed hyperlipidemia: Secondary | ICD-10-CM | POA: Insufficient documentation

## 2020-09-27 DIAGNOSIS — Z8719 Personal history of other diseases of the digestive system: Secondary | ICD-10-CM | POA: Insufficient documentation

## 2020-09-27 DIAGNOSIS — Z0000 Encounter for general adult medical examination without abnormal findings: Secondary | ICD-10-CM

## 2020-09-27 DIAGNOSIS — J029 Acute pharyngitis, unspecified: Secondary | ICD-10-CM

## 2020-09-27 DIAGNOSIS — R300 Dysuria: Secondary | ICD-10-CM

## 2020-09-27 DIAGNOSIS — Z8709 Personal history of other diseases of the respiratory system: Secondary | ICD-10-CM

## 2020-09-27 MED ORDER — ALBUTEROL SULFATE HFA 108 (90 BASE) MCG/ACT IN AERS: 2.0000 | INHALATION_SPRAY | Freq: Four times a day (QID) | RESPIRATORY_TRACT | 0 refills | Status: AC | PRN

## 2020-09-27 NOTE — Patient Instructions (Signed)
Goals  Try more smoothies to get in more spinach and other vegetables. May consider low sodium VS juice. Increasing walking to 30 minutes 5 times per week. Keep drinking water and lemon. Try hot tea-chamomile tea for soothing stomach and menstrual cramps. Try hot bath or heating pad for comfort with stomach cramps. May consider trying miralax or metamucil  in liquids to help increase fiber and lower cholesterol. Lose 3-5 lbs per month.

## 2020-09-27 NOTE — Progress Notes (Signed)
Medical Nutrition Therapy:  Appt start time: 1445 end time:  1515 Assessment:  Primary concerns today: Hyperlipidemia, IBS, Fatty liver, PCOS and morbid obesity. BMI 47. Still having some issues with throwing up foods.  Veggies, potatoes, spring salad, kale, romaine lettuce, and other foods that are crunchy or hard to chew. Tolerated well: apples, peach, cucumbers tomatoes.Tolerating green beans, carrots, Drinking  Water, honey and lemon Smoothies: tolerated well.  In the last week, has thrown up at least one a day.  Likes V8  Juice stopped due to high sodium content and her kidney doctor discouraged due to kidney stones.  High fiber foods: limited due to intolerance from GI or sensory issues. Can't tolerate acidic foods.  Exercise: walking more as tolerated. She says she has PCOS. Has fatty liver. Is taking her Crestor nightly.  Making some improvement in food choices. Lost 5 lbs. Willing to work on increasing exercise for needed weight loss.   CMP Latest Ref Rng & Units 07/26/2020 06/07/2020  Glucose 65 - 99 mg/dL - 417(E)  BUN 6 - 20 mg/dL - 10  Creatinine 0.81 - 1.00 mg/dL - 4.48  Sodium 185 - 631 mmol/L - 139  Potassium 3.5 - 5.2 mmol/L - 3.9  Chloride 96 - 106 mmol/L - 105  CO2 20 - 29 mmol/L - 20  Calcium 8.7 - 10.2 mg/dL - 9.6  Total Protein 6.5 - 8.1 g/dL 7.4 6.9  Total Bilirubin 0.3 - 1.2 mg/dL 0.8 0.4  Alkaline Phos 38 - 126 U/L 63 77  AST 15 - 41 U/L 83(H) 90(H)  ALT 0 - 44 U/L 145(H) 134(H)   Lab Results  Component Value Date   HGBA1C 5.3 07/12/2020   Lipid Panel     Component Value Date/Time   CHOL 260 (H) 06/14/2020 1144   TRIG 222 (H) 06/14/2020 1144   HDL 35 (L) 06/14/2020 1144   CHOLHDL 7.4 (H) 06/14/2020 1144   LDLCALC 183 (H) 06/14/2020 1144   LABVLDL 42 (H) 06/14/2020 1144   Wt Readings from Last 3 Encounters:  09/27/20 260 lb (117.9 kg)  09/27/20 261 lb 6.4 oz (118.6 kg)  08/22/20 265 lb 9.6 oz (120.5 kg)   Ht Readings from Last 3  Encounters:  09/27/20 5\' 3"  (1.6 m)  08/22/20 5\' 3"  (1.6 m)  07/12/20 5\' 3"  (1.6 m)   Body mass index is 46.06 kg/m. @BMIFA @ Facility age limit for growth percentiles is 20 years. Facility age limit for growth percentiles is 20 years.  Preferred Learning Style:   No preference indicated   Learning Readiness:   Ready  Change in progress   MEDICATIONS:    DIETARY INTAKE:   24-hr recall:  Apple and peaches , avocado and lettuce and tomato on gluten free bread, water Veggies burger with gluten free bred.   Estimated energy needs: 1200  calories 130 g carbohydrates 90 g protein 33 g fat  Progress Towards Goal(s):  In progress.   Nutritional Diagnosis:  NI-1.5 Excessive energy intake As related to Morbid Obesity and Hyperlipidemia.  As evidenced by BMI 47 and     Intervention:  My Plate, Meal planning, Hyperlipidemia, portion sizes, Gluten Free diet, consistency and timing of meals and benefits of high fiber foods.  Need for exercise 60 minutes 4 days per week Hydration and water intake. Avoiding processed foods and artificial sweeteners.  Goals  Try more smoothies to get in more spinach and other vegetables. May consider low sodium VS juice. Increasing walking to 30 minutes 5  times per week. Keep drinking water and lemon. Try hot tea-chamomile tea for soothing stomach and menstrual cramps. Try hot bath or heating pad for comfort with stomach cramps. May consider trying miralax or metamucil  in liquids to help increase fiber and lower cholesterol. Lose 3-5 lbs per month.   Teaching Method Utilized:  Visual Auditory Hands on  Handouts given during visit include:  The Plate Method  Meal Plan Card  Gluten Free Diet  Low Cholesterol foods.  Barriers to learning/adherence to lifestyle change: none  Demonstrated degree of understanding via:  Teach Back   Monitoring/Evaluation:  Dietary intake, exercise, , and body weight in 2  month(s).

## 2020-09-27 NOTE — Progress Notes (Signed)
Established Patient Office Visit  Subjective:  Patient ID: Patricia Bowers, female    DOB: [birth year]  Age: 29 y.o. MRN: [id number]  CC: Follow up  HPI Patricia Bowers presents for follow up of her hyperlipidemia, elevated LFTs, and IBS. Her lipid panel done on 06/14/2020 showed total cholesterol of 260 mg/dl, triglycerides of 376 mg/dl, and LDL of 283. She was seen by Dr. Fransico Him with Endocrinology on 08/22/2020 who recommended weight loss, sent patient to a dietician, and Crestor 10 mg PO nightly was initiated. She is to follow up with Endocrinology in February 2022 after repeat lipid panel and CMP. Patient's last hepatic function panel on 07/26/2020 showed elevated liver enzymes with her AST at 83 and ALT at 145. Her previous liver enzymes (on 06/07/20) were AST 90 and ALT 134. She was sent to GI Dr. Allegra Lai who she saw on 07/14/20 and sent for a RUQ ultrasound on 07/19/20 which revealed "echogenic liver consistent with steatosis." Dr. Allegra Lai also addressed patient's IBS and increased her amitriptyline dose to 50mg  at bedtime. She states that the higher amitriptyline dose is helping her IBS symptoms. Reflux is improving 30% since last visit with omeprazole compliance.  She is to follow up with GI in October 2021. Patient reports that with the IBS, it is difficult for her to eat things such as salad and vegetables because her symptoms worsen. She states that she has to eat what she can tolerate. She has an appointment with her dietician today (09/27/2020) to address this. She reports continued throat pain on the R side since April 2021. She reports that it is stable. States that she sometimes cant talk in the morning. States it is constant but worse in the morning and it sometimes feels swollen. Reports that she has tried tessalon pearls, viscous lidocaine, gargling salt water, and warm drinks, but they don't help. OTC cough drops help somewhat.  Pt also reports dysuria with hesitancy. Reports intermittent  sharp, shooting R flank pain especially with urination x 1 month. States nothing makes it better or worse. Denies hematuria or vaginal discharge. Patient offers no further complaint.     Past Medical History:  Diagnosis Date  . Asthma   . IBS (irritable bowel syndrome)   . Kidney stone     Past Surgical History:  Procedure Laterality Date  . CHOLECYSTECTOMY    . KIDNEY STONE SURGERY    . LASIK      Family History  Problem Relation Age of Onset  . Breast cancer Mother   . Cancer Mother   . Other Father        unknown medical history    Social History   Socioeconomic History  . Marital status: Single    Spouse name: Not on file  . Number of children: Not on file  . Years of education: Not on file  . Highest education level: Not on file  Occupational History  . Occupation: unemployed  Tobacco Use  . Smoking status: Never Smoker  . Smokeless tobacco: Never Used  Vaping Use  . Vaping Use: Never used  Substance and Sexual Activity  . Alcohol use: Never  . Drug use: Never  . Sexual activity: Not on file  Other Topics Concern  . Not on file  Social History Narrative  . Not on file   Social Determinants of Health   Financial Resource Strain: Medium Risk  . Difficulty of Paying Living Expenses: Somewhat hard  Food Insecurity: No Food Insecurity  . Worried About  Running Out of Food in the Last Year: Never true  . Ran Out of Food in the Last Year: Never true  Transportation Needs: No Transportation Needs  . Lack of Transportation (Medical): No  . Lack of Transportation (Non-Medical): No  Physical Activity: Inactive  . Days of Exercise per Week: 0 days  . Minutes of Exercise per Session: 0 min  Stress: Stress Concern Present  . Feeling of Stress : Very much  Social Connections: Moderately Integrated  . Frequency of Communication with Friends and Family: More than three times a week  . Frequency of Social Gatherings with Friends and Family: Once a week  . Attends  Religious Services: More than 4 times per year  . Active Member of Clubs or Organizations: Yes  . Attends Banker Meetings: More than 4 times per year  . Marital Status: Never married  Intimate Partner Violence: Not At Risk  . Fear of Current or Ex-Partner: No  . Emotionally Abused: No  . Physically Abused: No  . Sexually Abused: No    Outpatient Medications Prior to Visit  Medication Sig Dispense Refill  . hyoscyamine (LEVSIN) 0.125 MG tablet Take 1 tablet (0.125 mg total) by mouth every 4 (four) hours as needed. 30 tablet 0  . rosuvastatin (CRESTOR) 10 MG tablet Take 1 tablet (10 mg total) by mouth daily. 90 tablet 1  . albuterol (VENTOLIN HFA) 108 (90 Base) MCG/ACT inhaler Inhale 2 puffs into the lungs every 6 (six) hours as needed for wheezing or shortness of breath. 16 g 0  . amitriptyline (ELAVIL) 25 MG tablet Take 1 tablet (25 mg total) by mouth at bedtime. 30 tablet 2  . omeprazole (PRILOSEC) 40 MG capsule Take 1 capsule (40 mg total) by mouth daily before breakfast. 30 capsule 2   No facility-administered medications prior to visit.    Allergies  Allergen Reactions  . Morphine And Related Other (See Comments)    Dizziness, nausea, nightmares    ROS Review of Systems  Constitutional: Negative.   HENT: Positive for sore throat.   Respiratory: Negative.   Cardiovascular: Negative.   Endocrine: Positive for polydipsia.  Genitourinary: Positive for dysuria.  Neurological: Negative.       Objective:    Physical Exam Constitutional:      Appearance: Normal appearance. She is obese.  HENT:     Mouth/Throat:     Mouth: Mucous membranes are moist.     Pharynx: Oropharynx is clear. No oropharyngeal exudate or posterior oropharyngeal erythema.  Cardiovascular:     Heart sounds: Normal heart sounds.  Pulmonary:     Effort: Pulmonary effort is normal.     Breath sounds: Normal breath sounds.  Abdominal:     General: Bowel sounds are normal.      Palpations: Abdomen is soft.     Tenderness: There is right CVA tenderness. There is no left CVA tenderness.  Skin:    General: Skin is warm and dry.  Neurological:     General: No focal deficit present.     Mental Status: She is alert.     BP 123/84 (BP Location: Left Arm, Patient Position: Sitting, Cuff Size: Large)   Pulse 71   Resp 16   Wt 261 lb 6.4 oz (118.6 kg)   SpO2 98%   BMI 46.30 kg/m  Wt Readings from Last 3 Encounters:  09/27/20 261 lb 6.4 oz (118.6 kg)  08/22/20 265 lb 9.6 oz (120.5 kg)  07/12/20 263 lb (119.3 kg)  Patient encouraged to continue losing weight and has an appointment with her dietician today.   Health Maintenance Due  Topic Date Due  . COVID-19 Vaccine (1) Never done  . HIV Screening  Never done  . TETANUS/TDAP  Never done  . PAP-Cervical Cytology Screening  Never done  . PAP SMEAR-Modifier  Never done  . INFLUENZA VACCINE  07/30/2020    There are no preventive care reminders to display for this patient.  Lab Results  Component Value Date   TSH 0.886 06/07/2020   Lab Results  Component Value Date   WBC 6.9 06/07/2020   HGB 13.3 06/07/2020   HCT 39.2 06/07/2020   MCV 84 06/07/2020   PLT 246 06/07/2020   Lab Results  Component Value Date   NA 139 06/07/2020   K 3.9 06/07/2020   CO2 20 06/07/2020   GLUCOSE 107 (H) 06/07/2020   BUN 10 06/07/2020   CREATININE 0.63 06/07/2020   BILITOT 0.8 07/26/2020   ALKPHOS 63 07/26/2020   AST 83 (H) 07/26/2020   ALT 145 (H) 07/26/2020   PROT 7.4 07/26/2020   ALBUMIN 4.2 07/26/2020   CALCIUM 9.6 06/07/2020   Lab Results  Component Value Date   CHOL 260 (H) 06/14/2020   Lab Results  Component Value Date   HDL 35 (L) 06/14/2020   Lab Results  Component Value Date   LDLCALC 183 (H) 06/14/2020   Lab Results  Component Value Date   TRIG 222 (H) 06/14/2020   Lab Results  Component Value Date   CHOLHDL 7.4 (H) 06/14/2020   Lab Results  Component Value Date   HGBA1C 5.3  07/12/2020      Assessment & Plan:   1. History of asthma Avoid asthma triggers and use rescue inhaler as needed.  - albuterol (VENTOLIN HFA) 108 (90 Base) MCG/ACT inhaler; Inhale 2 puffs into the lungs every 6 (six) hours as needed for wheezing or shortness of breath.  Dispense: 16 g; Refill: 0  2. Sore throat Continue your home management with cough drops and follow up with GI at your scheduled October appointment.   3. Dysuria Continue pushing clear fluids as tolerated.  You will receive a call with your urine results. - UA/M w/rflx Culture, Routine  4. Health care maintenance Follow up with [facility] to schedule pap smear. Follow up with dietician at today's scheduled appointment. Follow up with Endocrinology as needed. - Ambulatory referral to Hematology / Oncology   Follow-up: Return in about 23 weeks (around 03/07/2021), or if symptoms worsen or fail to improve.   Kathlene November, Student-NP

## 2020-09-29 LAB — UA/M W/RFLX CULTURE, ROUTINE

## 2020-10-03 ENCOUNTER — Other Ambulatory Visit: Payer: Self-pay | Admitting: Gerontology

## 2020-10-03 DIAGNOSIS — R300 Dysuria: Secondary | ICD-10-CM

## 2020-10-19 ENCOUNTER — Other Ambulatory Visit: Payer: Self-pay | Admitting: Gastroenterology

## 2020-10-19 ENCOUNTER — Encounter: Payer: Self-pay | Admitting: Gastroenterology

## 2020-10-19 ENCOUNTER — Ambulatory Visit (INDEPENDENT_AMBULATORY_CARE_PROVIDER_SITE_OTHER): Payer: Self-pay | Admitting: Gastroenterology

## 2020-10-19 ENCOUNTER — Other Ambulatory Visit: Payer: Self-pay

## 2020-10-19 VITALS — BP 136/85 | HR 103 | Temp 98.0°F | Ht 63.0 in | Wt 259.0 lb

## 2020-10-19 DIAGNOSIS — R7989 Other specified abnormal findings of blood chemistry: Secondary | ICD-10-CM

## 2020-10-19 DIAGNOSIS — K760 Fatty (change of) liver, not elsewhere classified: Secondary | ICD-10-CM

## 2020-10-19 DIAGNOSIS — K580 Irritable bowel syndrome with diarrhea: Secondary | ICD-10-CM

## 2020-10-19 DIAGNOSIS — K219 Gastro-esophageal reflux disease without esophagitis: Secondary | ICD-10-CM

## 2020-10-19 MED ORDER — DESIPRAMINE HCL 25 MG PO TABS
25.0000 mg | ORAL_TABLET | Freq: Every day | ORAL | 2 refills | Status: DC
Start: 2020-10-19 — End: 2020-12-05

## 2020-10-19 NOTE — Progress Notes (Signed)
Arlyss Repress, MD [address]  [address]  [place], [place] [address]  Main: [phone]  Fax: [phone]    Gastroenterology Consultation  Referring Provider:     No ref. provider found Primary Care Physician:  System, Provider Not In Primary Gastroenterologist:  Dr. Arlyss Repress Reason for Consultation:     IBS, GERD        HPI:   Patricia Bowers is a 29 y.o. female referred by Dr. System, Provider Not In  for consultation & management of IBS, GERD Irritable bowel syndrome: Patient reports that when she was in [place], at age of 12, she was experiencing left-sided abdominal pain associated with abdominal cramps and nonbloody diarrhea.  She denies abdominal bloating.  She said she had undergone extensive work-up and she was told that she has irritable bowel syndrome.  She was taking Bentyl as needed for her symptoms.  Over the course of years, she was experiencing alternating episodes of constipation and diarrhea.  When she moved to [place], in 2018 she was evaluated by gastroenterologist for ongoing symptoms.  At that time, she underwent upper endoscopy as well as colonoscopy which were reportedly normal.  There was no evidence of H. pylori.  Celiac serologies were negative.  TSH normal.  No evidence of anemia.  She was on sublingual hyoscyamine in the past. She does acknowledge significant family stress in her personal life.  Unable to sleep well.  Patient is accompanied by her mom today.  She identified that she cannot tolerate lactose and gluten, therefore avoiding these  Chronic GERD: She reports heartburn, acid reflux for which she is taking pantoprazole.  She denies dysphagia, epigastric pain  Patient went to ER yesterday due to right shoulder pain after she fell last night and landing on her right shoulder.  There was no evidence of fracture.  She was discharged home on meloxicam and tramadol which she did not fill the prescription yet.  Follow-up  televisit 07/14/2020 Patricia Bowers originally seen by me in early June for management of chronic GERD and diarrhea predominant IBS.  Her GI symptoms are predominantly secondary to stress therefore, I started her on amitriptyline 25 mg at bedtime.  Patient notices improvement in her symptoms by 30 to 40%.  She is taking omeprazole 40 mg daily before meals which is helping partially with reflux symptoms.  She does notice nocturnal heartburn.  She is not keeping her head of the bed elevated.  She is also concerned about elevated transaminases.  Acute viral hepatitis panel is negative, will she denies any over-the-counter medication or herbal supplements She does not drink alcohol   Follow-up visit 10/19/2020 Patient reports that amitriptyline is significantly helping with her IBS symptoms.  However, she is now experiencing constipation, her last bowel movement was 3 days ago.  She reports her stools have been hard and associated with significant straining.  She does have history of heartburn which is fairly controlled on omeprazole 40 mg daily.  She lost about 5 to 6 pounds since last visit.  She says that she is trying to incorporate more healthy foods.  She started Crestor and took for 3 days, noticed swelling in the right side of her throat and pain.  She stopped 2 days ago.  She reports that the pain is improving.  NSAIDs: None  Antiplts/Anticoagulants/Anti thrombotics: None  GI Procedures: EGD and colonoscopy in 11/05/2017 at [facility] A.  Gastric antrum, biopsy: -Mild reactive gastropathy. -No Helicobacter pylori microorganisms identified on H&E  staining.  B.  Distal esophagus, biopsy: -Mucosa of the squamocolumnar junction. -No goblet cell metaplasia identified.  Past Medical History:  Diagnosis Date  . Asthma   . IBS (irritable bowel syndrome)   . Kidney stone     Past Surgical History:  Procedure Laterality Date  . CHOLECYSTECTOMY    . KIDNEY STONE SURGERY    . LASIK       Current Outpatient Medications:  .  amitriptyline (ELAVIL) 25 MG tablet, Take 1 tablet (25 mg total) by mouth at bedtime., Disp: 30 tablet, Rfl: 2 .  hyoscyamine (LEVSIN) 0.125 MG tablet, Take 1 tablet (0.125 mg total) by mouth every 4 (four) hours as needed., Disp: 30 tablet, Rfl: 0 .  albuterol (VENTOLIN HFA) 108 (90 Base) MCG/ACT inhaler, Inhale 2 puffs into the lungs every 6 (six) hours as needed for wheezing or shortness of breath. (Patient not taking: Reported on 10/19/2020), Disp: 16 g, Rfl: 0 .  desipramine (NORPRAMIN) 25 MG tablet, Take 1 tablet (25 mg total) by mouth at bedtime., Disp: 30 tablet, Rfl: 2 .  omeprazole (PRILOSEC) 40 MG capsule, Take 1 capsule (40 mg total) by mouth daily before breakfast. (Patient not taking: Reported on 10/19/2020), Disp: 30 capsule, Rfl: 2 .  rosuvastatin (CRESTOR) 10 MG tablet, Take 1 tablet (10 mg total) by mouth daily. (Patient not taking: Reported on 10/19/2020), Disp: 90 tablet, Rfl: 1    Family History  Problem Relation Age of Onset  . Breast cancer Mother   . Cancer Mother   . Other Father        unknown medical history     Social History   Tobacco Use  . Smoking status: Never Smoker  . Smokeless tobacco: Never Used  Vaping Use  . Vaping Use: Never used  Substance Use Topics  . Alcohol use: Never  . Drug use: Never    Allergies as of 10/19/2020 - Review Complete 10/19/2020  Allergen Reaction Noted  . Morphine and related Other (See Comments) 02/23/2020    Review of Systems:    All systems reviewed and negative except where noted in HPI.   Physical Exam:  BP 136/85 (BP Location: Left Arm, Patient Position: Sitting, Cuff Size: Large)   Pulse (!) 103   Temp 98 F (36.7 C) (Oral)   Ht 5\' 3"  (1.6 m)   Wt 259 lb (117.5 kg)   BMI 45.88 kg/m  No LMP recorded.  General:   Alert,  Well-developed, well-nourished, pleasant and cooperative in NAD Head:  Normocephalic and atraumatic. Eyes:  Sclera clear, no icterus.    Conjunctiva pink. Ears:  Normal auditory acuity. Nose:  No deformity, discharge, or lesions. Mouth:  No deformity or lesions,oropharynx pink & moist. Neck:  Supple; no masses or thyromegaly. Lungs:  Respirations even and unlabored.  Clear throughout to auscultation.   No wheezes, crackles, or rhonchi. No acute distress. Heart:  Regular rate and rhythm; no murmurs, clicks, rubs, or gallops. Abdomen:  Normal bowel sounds. Soft, non-tender and non-distended without masses, hepatosplenomegaly or hernias noted.  No guarding or rebound tenderness.   Rectal: Not performed Msk:  Symmetrical without gross deformities. Good, equal movement & strength bilaterally. Pulses:  Normal pulses noted. Extremities:  No clubbing or edema.  No cyanosis. Neurologic:  Alert and oriented x3;  grossly normal neurologically. Skin:  Intact without significant lesions or rashes. No jaundice. Psych:  Alert and cooperative. Normal mood and affect.  Imaging Studies: No abdominal imaging  Assessment and Plan:  Patricia Bowers is a 29 y.o. female with morbid obesity is seen in consultation for management of diarrhea predominant irritable bowel syndrome, chronic GERD and elevated transaminases  Elevated LFTs, 2-3 times upper limit of normal: Likely secondary to fatty liver based on right upper quadrant ultrasound Acute viral hepatitis panel negative Recheck LFTs today, if persistently elevated and not improving, will perform secondary liver disease work-up Reiterated on healthy eating habits, regular physical activity and weight loss  IBS-diarrhea: Fairly controlled on amitriptyline Amitriptyline resulted in constipation, therefore we will try desipramine 25 mg at bedtime, patient is agreeable Continue hyoscyamine as needed If symptoms are worsening, will try 2 weeks course of rifaximin   Chronic GERD Reiterated on antireflux lifestyle Continue omeprazole 40 mg daily before breakfast   Follow up in 3  months   Arlyss Repress, MD

## 2020-10-20 LAB — HEPATIC FUNCTION PANEL
ALT: 106 IU/L — ABNORMAL HIGH (ref 0–32)
AST: 64 IU/L — ABNORMAL HIGH (ref 0–40)
Albumin: 4.6 g/dL (ref 3.9–5.0)
Alkaline Phosphatase: 75 IU/L (ref 44–121)
Bilirubin Total: 0.4 mg/dL (ref 0.0–1.2)
Bilirubin, Direct: 0.12 mg/dL (ref 0.00–0.40)
Total Protein: 7.5 g/dL (ref 6.0–8.5)

## 2020-11-06 ENCOUNTER — Telehealth: Payer: Self-pay | Admitting: Nutrition

## 2020-11-06 ENCOUNTER — Encounter: Payer: Self-pay | Attending: Nutrition | Admitting: Nutrition

## 2020-11-06 DIAGNOSIS — E282 Polycystic ovarian syndrome: Secondary | ICD-10-CM

## 2020-11-06 DIAGNOSIS — E785 Hyperlipidemia, unspecified: Secondary | ICD-10-CM

## 2020-11-06 DIAGNOSIS — K5900 Constipation, unspecified: Secondary | ICD-10-CM

## 2020-11-06 DIAGNOSIS — K219 Gastro-esophageal reflux disease without esophagitis: Secondary | ICD-10-CM

## 2020-11-06 DIAGNOSIS — R748 Abnormal levels of other serum enzymes: Secondary | ICD-10-CM

## 2020-11-06 DIAGNOSIS — K760 Fatty (change of) liver, not elsewhere classified: Secondary | ICD-10-CM

## 2020-11-06 DIAGNOSIS — Z8719 Personal history of other diseases of the digestive system: Secondary | ICD-10-CM

## 2020-11-06 DIAGNOSIS — E782 Mixed hyperlipidemia: Secondary | ICD-10-CM

## 2020-11-06 NOTE — Patient Instructions (Addendum)
Goals  Try prunes, raisins and fruit with meals. Increase water intake to 84 oz.- Try carrots for snacks. Try Inda [place] walking down the pounds tape. Walk 30 minutes a day. Try miralax 1 cap twice a day. May try hot tea for luqids. Lose 2-5 lbs per month.

## 2020-11-06 NOTE — Progress Notes (Signed)
Mychart visit. Medical Nutrition Therapy:  Appt start time: 0815 end time:  0845 Assessment:  Primary concerns today: Hyperlipidemia, IBS, Fatty liver, PCOS and morbid obesity. BMI 47. Sees Dr. Fransico Him, Hyperlipidemia. Lost 1 lb.  Hasn't been able to walk as much. Eating better and tolerating food choices better. Hasn't been throwing up much. Has had issues with constipation. Tried waffers and they have helped some. Can't eat beef.. Doing well with chicken, [place] and some fish. Tolerating more fruits and vegetables. Working on drinking more water.   Has been getting in 32 oz of water per day.  Bowel regimen constipated.   Exercise: walking more as tolerated. She says she has PCOS. Has fatty liver. Is taking her Crestor nightly.  Making some improvement in food choices. Lost 5 lbs. Willing to work on increasing exercise for needed weight loss.   CMP Latest Ref Rng & Units 10/19/2020 07/26/2020 06/07/2020  Glucose 65 - 99 mg/dL - - 130(Q)  BUN 6 - 20 mg/dL - - 10  Creatinine 6.57 - 1.00 mg/dL - - 8.46  Sodium 962 - 144 mmol/L - - 139  Potassium 3.5 - 5.2 mmol/L - - 3.9  Chloride 96 - 106 mmol/L - - 105  CO2 20 - 29 mmol/L - - 20  Calcium 8.7 - 10.2 mg/dL - - 9.6  Total Protein 6.0 - 8.5 g/dL 7.5 7.4 6.9  Total Bilirubin 0.0 - 1.2 mg/dL 0.4 0.8 0.4  Alkaline Phos 44 - 121 IU/L 75 63 77  AST 0 - 40 IU/L 64(H) 83(H) 90(H)  ALT 0 - 32 IU/L 106(H) 145(H) 134(H)   Lab Results  Component Value Date   HGBA1C 5.3 07/12/2020   Lipid Panel     Component Value Date/Time   CHOL 260 (H) 06/14/2020 1144   TRIG 222 (H) 06/14/2020 1144   HDL 35 (L) 06/14/2020 1144   CHOLHDL 7.4 (H) 06/14/2020 1144   LDLCALC 183 (H) 06/14/2020 1144   LABVLDL 42 (H) 06/14/2020 1144   Wt Readings from Last 3 Encounters:  10/19/20 259 lb (117.5 kg)  09/27/20 260 lb (117.9 kg)  09/27/20 261 lb 6.4 oz (118.6 kg)   Ht Readings from Last 3 Encounters:  10/19/20 5\' 3"  (1.6 m)  09/27/20 5\' 3"  (1.6 m)  08/22/20  5\' 3"  (1.6 m)   There is no height or weight on file to calculate BMI. @BMIFA @ Facility age limit for growth percentiles is 20 years. Facility age limit for growth percentiles is 20 years.  Preferred Learning Style:   No preference indicated   Learning Readiness:   Ready  Change in progress   MEDICATIONS:    DIETARY INTAKE:   24-hr recall:  Avocado on toast, eggs, carrots and apple, 16 oz of water  Lunch: chicken sandwich with no cheese, lettuce, avacado,; celery . 16 oz water  Dinner: burger with tomatoes, onions, potatoes-grilled. 8 oz.    Estimated energy needs: 1200  calories 130 g carbohydrates 90 g protein 33 g fat  Progress Towards Goal(s):  In progress.   Nutritional Diagnosis:  NI-1.5 Excessive energy intake As related to Morbid Obesity and Hyperlipidemia.  As evidenced by BMI 47 and     Intervention:  My Plate, Meal planning, Hyperlipidemia, portion sizes, Gluten Free diet, consistency and timing of meals and benefits of high fiber foods.  Need for exercise 60 minutes 4 days per week Hydration and water intake. Avoiding processed foods and artificial sweeteners.  Goals  Try prunes, raisins and fruit  with meals. Increase water intake to 84 oz.- Try carrots for snacks. Try Inda Castle walking down the pounds tape. Walk 30 minutes a day. Try miralax 1 cap twice a day. May try hot tea for liquids and help relax GI track.  Lose 2-5 lbs per month.  Teaching Method Utilized:  Visual Auditory Hands on  Handouts given during visit include:  The Plate Method  Meal Plan Card  Gluten Free Diet  Low Cholesterol foods.  Barriers to learning/adherence to lifestyle change: none  Demonstrated degree of understanding via:  Teach Back   Monitoring/Evaluation:  Dietary intake, exercise, , and body weight in 2  Month(s). She would benefit from work up and referral to counseling for possible depression.

## 2020-11-09 ENCOUNTER — Emergency Department
Admission: EM | Admit: 2020-11-09 | Discharge: 2020-11-10 | Disposition: A | Discharge status: Home / Self Care | Payer: Self-pay | Attending: Emergency Medicine | Admitting: Emergency Medicine

## 2020-11-09 ENCOUNTER — Encounter: Payer: Self-pay | Admitting: Nutrition

## 2020-11-09 ENCOUNTER — Other Ambulatory Visit: Payer: Self-pay

## 2020-11-09 ENCOUNTER — Emergency Department: Payer: Self-pay

## 2020-11-09 DIAGNOSIS — Z20822 Contact with and (suspected) exposure to covid-19: Secondary | ICD-10-CM | POA: Insufficient documentation

## 2020-11-09 DIAGNOSIS — Z79899 Other long term (current) drug therapy: Secondary | ICD-10-CM | POA: Insufficient documentation

## 2020-11-09 DIAGNOSIS — J45909 Unspecified asthma, uncomplicated: Secondary | ICD-10-CM | POA: Insufficient documentation

## 2020-11-09 DIAGNOSIS — I10 Essential (primary) hypertension: Secondary | ICD-10-CM | POA: Insufficient documentation

## 2020-11-09 DIAGNOSIS — J209 Acute bronchitis, unspecified: Secondary | ICD-10-CM | POA: Insufficient documentation

## 2020-11-09 DIAGNOSIS — Z8616 Personal history of COVID-19: Secondary | ICD-10-CM | POA: Insufficient documentation

## 2020-11-09 LAB — CBC
HCT: 43.1 % (ref 36.0–46.0)
Hemoglobin: 14.4 g/dL (ref 12.0–15.0)
MCH: 28.5 pg (ref 26.0–34.0)
MCHC: 33.4 g/dL (ref 30.0–36.0)
MCV: 85.2 fL (ref 80.0–100.0)
Platelets: 298 10*3/uL (ref 150–400)
RBC: 5.06 MIL/uL (ref 3.87–5.11)
RDW: 13.1 % (ref 11.5–15.5)
WBC: 15.5 10*3/uL — ABNORMAL HIGH (ref 4.0–10.5)
nRBC: 0 % (ref 0.0–0.2)

## 2020-11-09 LAB — COMPREHENSIVE METABOLIC PANEL
ALT: 101 U/L — ABNORMAL HIGH (ref 0–44)
AST: 72 U/L — ABNORMAL HIGH (ref 15–41)
Albumin: 4.5 g/dL (ref 3.5–5.0)
Alkaline Phosphatase: 70 U/L (ref 38–126)
Anion gap: 11 (ref 5–15)
BUN: 12 mg/dL (ref 6–20)
CO2: 22 mmol/L (ref 22–32)
Calcium: 9 mg/dL (ref 8.9–10.3)
Chloride: 105 mmol/L (ref 98–111)
Creatinine, Ser: 0.67 mg/dL (ref 0.44–1.00)
GFR, Estimated: >60 mL/min (ref >60)
Glucose, Bld: 103 mg/dL — ABNORMAL HIGH (ref 70–99)
Potassium: 3.9 mmol/L (ref 3.5–5.1)
Sodium: 138 mmol/L (ref 135–145)
Total Bilirubin: 0.7 mg/dL (ref 0.3–1.2)
Total Protein: 8.2 g/dL — ABNORMAL HIGH (ref 6.5–8.1)

## 2020-11-09 LAB — RESPIRATORY PANEL BY RT PCR (FLU A&B, COVID)
Influenza A by PCR: NEGATIVE
Influenza B by PCR: NEGATIVE
SARS Coronavirus 2 by RT PCR: NEGATIVE

## 2020-11-09 MED ORDER — IPRATROPIUM-ALBUTEROL 0.5-2.5 (3) MG/3ML IN SOLN
3.0000 mL | Freq: Once | RESPIRATORY_TRACT | Status: AC
  Administered 2020-11-09: 3 mL via RESPIRATORY_TRACT
  Filled 2020-11-09: qty 3

## 2020-11-09 MED ORDER — HYDROCOD POLST-CPM POLST ER 10-8 MG/5ML PO SUER
5.0000 mL | Freq: Once | ORAL | Status: AC
  Administered 2020-11-09: 5 mL via ORAL
  Filled 2020-11-09: qty 5

## 2020-11-09 MED ORDER — PREDNISONE 20 MG PO TABS
60.0000 mg | ORAL_TABLET | Freq: Once | ORAL | Status: AC
  Administered 2020-11-09: 60 mg via ORAL
  Filled 2020-11-09: qty 3

## 2020-11-09 NOTE — ED Notes (Signed)
Patient arrived in room from triage at this time 

## 2020-11-09 NOTE — ED Provider Notes (Signed)
[facility] Emergency Department Provider Note   ____________________________________________   First MD Initiated Contact with Patient 11/09/20 2314     (approximate)  I have reviewed the triage vital signs and the nursing notes.   HISTORY  Chief Complaint Cough    HPI Patricia Bowers is a 29 y.o. female who presents to the ED from home with a chief complaint of cough. Onset of symptoms yesterday associated with generalized malaise. History of asthma and states her inhaler is not working. Denies fever, chills, chest pain, shortness of breath, abdominal pain, nausea, vomiting or diarrhea.      Past Medical History:  Diagnosis Date  . Asthma   . IBS (irritable bowel syndrome)   . Kidney stone     Patient Active Problem List   Diagnosis Date Noted  . Dysuria 09/27/2020  . Mixed hyperlipidemia 08/22/2020  . Excessive cerumen in left ear canal 07/12/2020  . History of IBS 06/13/2020  . Elevated lipids 06/13/2020  . Elevated liver enzymes 06/13/2020  . Insomnia 06/13/2020  . Sore throat 06/13/2020  . Cough 06/13/2020  . History of asthma 05/25/2020  . Contact with or exposure to viral disease 04/17/2019  . Exposure to SARS-associated coronavirus 04/17/2019  . Right flank pain 09/16/2017  . Nephrolithiasis 06/12/2017  . Dysthymia 06/07/2016  . Hair loss 06/07/2016  . Morbid obesity due to excess calories (HCC) 06/07/2016  . Nonintractable episodic headache 01/28/2015  . Chronic fatigue 01/27/2015  . Gastroesophageal reflux disease without esophagitis 01/27/2015  . Irritable bowel syndrome with diarrhea 01/27/2015  . Polycystic ovarian syndrome 01/27/2015    Past Surgical History:  Procedure Laterality Date  . CHOLECYSTECTOMY    . KIDNEY STONE SURGERY    . LASIK      Prior to Admission medications   Medication Sig Start Date End Date Taking? Authorizing Provider  albuterol (PROVENTIL) (2.5 MG/3ML) 0.083% nebulizer solution Take 3  mLs (2.5 mg total) by nebulization every 4 (four) hours as needed for wheezing or shortness of breath. 11/10/20   Irean Hong, MD  albuterol (VENTOLIN HFA) 108 (90 Base) MCG/ACT inhaler Inhale 2 puffs into the lungs every 6 (six) hours as needed for wheezing or shortness of breath. Patient not taking: Reported on 10/19/2020 09/27/20   Iloabachie, Chioma E, NP  amitriptyline (ELAVIL) 25 MG tablet Take 1 tablet (25 mg total) by mouth at bedtime. 06/02/20 10/19/20  Toney Reil, MD  chlorpheniramine-HYDROcodone (TUSSIONEX PENNKINETIC ER) 10-8 MG/5ML SUER Take 5 mLs by mouth 2 (two) times daily. 11/10/20   Irean Hong, MD  desipramine (NORPRAMIN) 25 MG tablet Take 1 tablet (25 mg total) by mouth at bedtime. 10/19/20   Toney Reil, MD  desipramine (NORPRAMIN) 25 MG tablet Take 25 mg by mouth daily.    [provider]  hyoscyamine (LEVSIN) 0.125 MG tablet Take 1 tablet (0.125 mg total) by mouth every 4 (four) hours as needed. 06/02/20   Toney Reil, MD  Nebulizers (COMPRESSOR/NEBULIZER) MISC 1 Units by Does not apply route every 4 (four) hours as needed. 11/10/20   Irean Hong, MD  omeprazole (PRILOSEC) 40 MG capsule Take 1 capsule (40 mg total) by mouth daily before breakfast. Patient not taking: Reported on 10/19/2020 07/12/20 08/11/20  Iloabachie, Chioma E, NP  predniSONE (DELTASONE) 20 MG tablet 3 tablets daily x 4 days 11/10/20   Irean Hong, MD  rosuvastatin (CRESTOR) 10 MG tablet Take 1 tablet (10 mg total) by mouth daily. Patient not  taking: Reported on 10/19/2020 08/22/20   Roma Kayser, MD    Allergies Morphine and related  Family History  Problem Relation Age of Onset  . Breast cancer Mother   . Cancer Mother   . Other Father        unknown medical history    Social History Social History   Tobacco Use  . Smoking status: Never Smoker  . Smokeless tobacco: Never Used  Vaping Use  . Vaping Use: Never used  Substance Use Topics  . Alcohol  use: Never  . Drug use: Never    Review of Systems  Constitutional: Positive for generalized malaise.  No fever/chills Eyes: No visual changes. ENT: No sore throat. Cardiovascular: Denies chest pain. Respiratory: Positive for dry cough.  Denies shortness of breath. Gastrointestinal: No abdominal pain.  No nausea, no vomiting.  No diarrhea.  No constipation. Genitourinary: Negative for dysuria. Musculoskeletal: Negative for back pain. Skin: Negative for rash. Neurological: Negative for headaches, focal weakness or numbness.   ____________________________________________   PHYSICAL EXAM:  VITAL SIGNS: ED Triage Vitals  Enc Vitals Group     BP 11/09/20 2043 (!) 144/97     Pulse Rate 11/09/20 2043 (!) 129     Resp 11/09/20 2043 (!) 26     Temp 11/09/20 2048 100 F (37.8 C)     Temp Source 11/09/20 2048 Oral     SpO2 11/09/20 2043 96 %     Weight 11/09/20 2044 230 lb (104.3 kg)     Height 11/09/20 2044 5\' 4"  (1.626 m)     Head Circumference --      Peak Flow --      Pain Score 11/09/20 2044 5     Pain Loc --      Pain Edu? --      Excl. in GC? --     Constitutional: Alert and oriented. Well appearing and in mild acute distress. Eyes: Conjunctivae are normal. PERRL. EOMI. Head: Atraumatic. Nose: Congestion/rhinnorhea. Mouth/Throat: Mucous membranes are moist.   Neck: No stridor.   Cardiovascular: Normal rate, regular rhythm. Grossly normal heart sounds.  Good peripheral circulation. Respiratory: Normal respiratory effort.  No retractions. Lungs CTAB.  Active dry cough noted. Gastrointestinal: Soft and nontender. No distention. No abdominal bruits. No CVA tenderness. Musculoskeletal: No lower extremity tenderness nor edema.  No joint effusions. Neurologic:  Normal speech and language. No gross focal neurologic deficits are appreciated. No gait instability. Skin:  Skin is warm, dry and intact. No rash noted. Psychiatric: Mood and affect are normal. Speech and behavior  are normal.  ____________________________________________   LABS (all labs ordered are listed, but only abnormal results are displayed)  Labs Reviewed  CBC - Abnormal; Notable for the following components:      Result Value   WBC 15.5 (*)    All other components within normal limits  COMPREHENSIVE METABOLIC PANEL - Abnormal; Notable for the following components:   Glucose, Bld 103 (*)    Total Protein 8.2 (*)    AST 72 (*)    ALT 101 (*)    All other components within normal limits  RESPIRATORY PANEL BY RT PCR (FLU A&B, COVID)   ____________________________________________  EKG  None ____________________________________________  RADIOLOGY I, Fabion Gatson J, personally viewed and evaluated these images (plain radiographs) as part of my medical decision making, as well as reviewing the written report by the radiologist.  ED MD interpretation:  No acute cardiopulmonary process  Official radiology report(s): DG Chest 2  View  Result Date: 11/09/2020 CLINICAL DATA:  Cough EXAM: CHEST - 2 VIEW COMPARISON:  None. FINDINGS: The heart size and mediastinal contours are within normal limits. Both lungs are clear. The visualized skeletal structures are unremarkable. IMPRESSION: No active cardiopulmonary disease. Electronically Signed   By: Jasmine Pang M.D.   On: 11/09/2020 21:06    ____________________________________________   PROCEDURES  Procedure(s) performed (including Critical Care):  Procedures   ____________________________________________   INITIAL IMPRESSION / ASSESSMENT AND PLAN / ED COURSE  As part of my medical decision making, I reviewed the following data within the electronic MEDICAL RECORD NUMBER Nursing notes reviewed and incorporated, Labs reviewed, Old chart reviewed (GI office visits), Radiograph reviewed and Notes from prior ED visits (visits for asthma)     29 year old asthmatic female presenting with cough and malaise. Differential includes, but is not  limited to, viral syndrome, bronchitis including COPD exacerbation, pneumonia, reactive airway disease including asthma, CHF including exacerbation with or without pulmonary/interstitial edema, pneumothorax, ACS, thoracic trauma, and pulmonary embolism.  Laboratory and imaging results unremarkabl e. Will administer duoneb, prednisone, tussionex and reassess.  Clinical Course as of Nov 10 404  Fri Nov 10, 2020  0027 Cough and aeration improved.  Patient has not had issue with elevated blood pressures before.  I did speak with patient and her mother about keeping an eye on her blood pressures and seeing her PCP closely.  Will discharge home on prednisone, Tussionex as needed, albuterol nebulizer and solutions.  Strict return precautions given.  Both verbalized understanding and agree with plan of care.   [JS]    Clinical Course User Index [JS] Irean Hong, MD     ____________________________________________   FINAL CLINICAL IMPRESSION(S) / ED DIAGNOSES  Final diagnoses:  Acute bronchitis, unspecified organism  Hypertension, unspecified type     ED Discharge Orders         Ordered    predniSONE (DELTASONE) 20 MG tablet        11/10/20 0028    chlorpheniramine-HYDROcodone (TUSSIONEX PENNKINETIC ER) 10-8 MG/5ML SUER  2 times daily        11/10/20 0028    albuterol (PROVENTIL) (2.5 MG/3ML) 0.083% nebulizer solution  Every 4 hours PRN        11/10/20 0028    Nebulizers (COMPRESSOR/NEBULIZER) MISC  Every 4 hours PRN        11/10/20 0028          *Please note:  Patricia Bowers was evaluated in Emergency Department on 11/10/2020 for the symptoms described in the history of present illness. She was evaluated in the context of the global COVID-19 pandemic, which necessitated consideration that the patient might be at risk for infection with the SARS-CoV-2 virus that causes COVID-19. Institutional protocols and algorithms that pertain to the evaluation of patients at risk for  COVID-19 are in a state of rapid change based on information released by regulatory bodies including the CDC and federal and state organizations. These policies and algorithms were followed during the patient's care in the ED.  Some ED evaluations and interventions may be delayed as a result of limited staffing during and the pandemic.*   Note:  This document was prepared using Dragon voice recognition software and may include unintentional dictation errors.   Irean Hong, MD 11/10/20 [phone]

## 2020-11-09 NOTE — ED Triage Notes (Signed)
Pt in with co cough that started today. Yesterday had some general malaise. Pt states has forceful coughing with clear sputum at present.

## 2020-11-10 ENCOUNTER — Other Ambulatory Visit: Payer: Self-pay | Admitting: Emergency Medicine

## 2020-11-10 MED ORDER — PREDNISONE 20 MG PO TABS: ORAL_TABLET | ORAL | 0 refills | Status: DC

## 2020-11-10 MED ORDER — HYDROCOD POLST-CPM POLST ER 10-8 MG/5ML PO SUER: 5.0000 mL | Freq: Two times a day (BID) | ORAL | 0 refills | Status: DC

## 2020-11-10 MED ORDER — COMPRESSOR/NEBULIZER MISC
1.0000 [IU] | 0 refills | Status: AC | PRN
Start: 2020-11-10 — End: ?

## 2020-11-10 MED ORDER — ALBUTEROL SULFATE (2.5 MG/3ML) 0.083% IN NEBU
2.5000 mg | INHALATION_SOLUTION | RESPIRATORY_TRACT | 0 refills | Status: DC | PRN
Start: 2020-11-10 — End: 2020-11-10

## 2020-11-10 NOTE — Discharge Instructions (Addendum)
1.  Finish steroid as prescribed (Prednisone 60 mg daily x4 days). 2.  You may take Tussionex as needed for cough. 3.  You may use Albuterol nebulizer every 4 hours as needed for cough/wheezing/difficulty breathing. 4.  Keep a log of your blood pressures morning, noon and evening.  Bring this when you follow-up with your doctor. 5.  Return to the ER for worsening symptoms, persistent vomiting, difficulty breathing or other concerns.

## 2020-12-04 ENCOUNTER — Encounter: Payer: Self-pay | Admitting: Gastroenterology

## 2020-12-04 NOTE — Telephone Encounter (Signed)
Patient has appointment on 01/25/2021. Made patient a appointment on 12/05/2020 with you at 9:15am

## 2020-12-05 ENCOUNTER — Other Ambulatory Visit: Payer: Self-pay | Admitting: Gastroenterology

## 2020-12-05 ENCOUNTER — Encounter: Payer: Self-pay | Admitting: Gastroenterology

## 2020-12-05 ENCOUNTER — Ambulatory Visit (INDEPENDENT_AMBULATORY_CARE_PROVIDER_SITE_OTHER): Payer: Self-pay | Admitting: Gastroenterology

## 2020-12-05 VITALS — BP 131/91 | HR 81 | Temp 98.2°F | Ht 63.0 in | Wt 260.4 lb

## 2020-12-05 DIAGNOSIS — K600 Acute anal fissure: Secondary | ICD-10-CM

## 2020-12-05 DIAGNOSIS — K219 Gastro-esophageal reflux disease without esophagitis: Secondary | ICD-10-CM

## 2020-12-05 DIAGNOSIS — K580 Irritable bowel syndrome with diarrhea: Secondary | ICD-10-CM

## 2020-12-05 DIAGNOSIS — G8929 Other chronic pain: Secondary | ICD-10-CM

## 2020-12-05 DIAGNOSIS — M542 Cervicalgia: Secondary | ICD-10-CM

## 2020-12-05 MED ORDER — OMEPRAZOLE 40 MG PO CPDR: 40.0000 mg | DELAYED_RELEASE_CAPSULE | Freq: Two times a day (BID) | ORAL | 0 refills | Status: DC

## 2020-12-05 NOTE — Progress Notes (Signed)
Arlyss Repress, MD [address]  [address]  [place], [place] [address]  Main: [phone]  Fax: [phone]    Gastroenterology Consultation  Referring Provider:     No ref. provider found Primary Care Physician:  System, Provider Not In Primary Gastroenterologist:  Dr. Arlyss Repress Reason for Consultation:     IBS, GERD        HPI:   Daisha Filosa is a 29 y.o. female referred by Dr. System, Provider Not In  for consultation & management of IBS, GERD Irritable bowel syndrome: Patient reports that when she was in [place], at age of 32, she was experiencing left-sided abdominal pain associated with abdominal cramps and nonbloody diarrhea.  She denies abdominal bloating.  She said she had undergone extensive work-up and she was told that she has irritable bowel syndrome.  She was taking Bentyl as needed for her symptoms.  Over the course of years, she was experiencing alternating episodes of constipation and diarrhea.  When she moved to [place], in 2018 she was evaluated by gastroenterologist for ongoing symptoms.  At that time, she underwent upper endoscopy as well as colonoscopy which were reportedly normal.  There was no evidence of H. pylori.  Celiac serologies were negative.  TSH normal.  No evidence of anemia.  She was on sublingual hyoscyamine in the past. She does acknowledge significant family stress in her personal life.  Unable to sleep well.  Patient is accompanied by her mom today.  She identified that she cannot tolerate lactose and gluten, therefore avoiding these  Chronic GERD: She reports heartburn, acid reflux for which she is taking pantoprazole.  She denies dysphagia, epigastric pain  Patient went to ER yesterday due to right shoulder pain after she fell last night and landing on her right shoulder.  There was no evidence of fracture.  She was discharged home on meloxicam and tramadol which she did not fill the prescription yet.  Follow-up  televisit 07/14/2020 Shaylinn Hladik originally seen by me in early June for management of chronic GERD and diarrhea predominant IBS.  Her GI symptoms are predominantly secondary to stress therefore, I started her on amitriptyline 25 mg at bedtime.  Patient notices improvement in her symptoms by 30 to 40%.  She is taking omeprazole 40 mg daily before meals which is helping partially with reflux symptoms.  She does notice nocturnal heartburn.  She is not keeping her head of the bed elevated.  She is also concerned about elevated transaminases.  Acute viral hepatitis panel is negative, will she denies any over-the-counter medication or herbal supplements She does not drink alcohol  Follow-up visit 10/19/2020 Patient reports that amitriptyline is significantly helping with her IBS symptoms.  However, she is now experiencing constipation, her last bowel movement was 3 days ago.  She reports her stools have been hard and associated with significant straining.  She does have history of heartburn which is fairly controlled on omeprazole 40 mg daily.  She lost about 5 to 6 pounds since last visit.  She says that she is trying to incorporate more healthy foods.  She started Crestor and took for 3 days, noticed swelling in the right side of her throat and pain.  She stopped 2 days ago.  She reports that the pain is improving.  Follow-up visit 01/06/2020 Patient made an urgent visit due to worsening of her GI symptoms.  Patient reports that she was constipated over the weekend, followed by taking a stool softener which  has resulted in diarrhea and episodes of bright red blood per rectum.  Patient was concerned about it and she became very tearful.  She also reports ongoing right-sided neck pain that interferes with swallowing.  Patient is tearful in office today.  Desipramine is not helping, rather is causing constipation.  Patient acknowledges spending significant amount of time in toilet due to straining and  pushing, incomplete emptying.  She does report severe rectal pain as well.  Patient is accompanied by her mom  NSAIDs: None  Antiplts/Anticoagulants/Anti thrombotics: None  GI Procedures: EGD and colonoscopy in 11/05/2017 at [facility].  Gastric antrum, biopsy: -Mild reactive gastropathy. -No Helicobacter pylori microorganisms identified on H&E staining.  B.  Distal esophagus, biopsy: -Mucosa of the squamocolumnar junction. -No goblet cell metaplasia identified.  Past Medical History:  Diagnosis Date  . Asthma   . IBS (irritable bowel syndrome)   . Kidney stone     Past Surgical History:  Procedure Laterality Date  . CHOLECYSTECTOMY    . KIDNEY STONE SURGERY    . LASIK      Current Outpatient Medications:  .  albuterol (PROVENTIL) (2.5 MG/3ML) 0.083% nebulizer solution, Take 3 mLs (2.5 mg total) by nebulization every 4 (four) hours as needed for wheezing or shortness of breath., Disp: 75 mL, Rfl: 0 .  albuterol (VENTOLIN HFA) 108 (90 Base) MCG/ACT inhaler, Inhale 2 puffs into the lungs every 6 (six) hours as needed for wheezing or shortness of breath., Disp: 16 g, Rfl: 0 .  hyoscyamine (LEVSIN) 0.125 MG tablet, Take 1 tablet (0.125 mg total) by mouth every 4 (four) hours as needed., Disp: 30 tablet, Rfl: 0 .  Nebulizers (COMPRESSOR/NEBULIZER) MISC, 1 Units by Does not apply route every 4 (four) hours as needed., Disp: 1 each, Rfl: 0 .  omeprazole (PRILOSEC) 40 MG capsule, Take 1 capsule (40 mg total) by mouth 2 (two) times daily before a meal., Disp: 60 capsule, Rfl: 0 .  rosuvastatin (CRESTOR) 10 MG tablet, Take 1 tablet (10 mg total) by mouth daily., Disp: 90 tablet, Rfl: 1    Family History  Problem Relation Age of Onset  . Breast cancer Mother   . Cancer Mother   . Other Father        unknown medical history     Social History   Tobacco Use  . Smoking status: Never Smoker  . Smokeless tobacco: Never Used  Vaping Use  . Vaping Use: Never used  Substance Use Topics   . Alcohol use: Never  . Drug use: Never    Allergies as of 12/05/2020 - Review Complete 12/05/2020  Allergen Reaction Noted  . Morphine and related Other (See Comments) 02/23/2020    Review of Systems:    All systems reviewed and negative except where noted in HPI.   Physical Exam:  BP (!) 131/91 (BP Location: Left Arm, Patient Position: Sitting, Cuff Size: Normal)   Pulse 81   Temp 98.2 F (36.8 C) (Oral)   Ht 5\' 3"  (1.6 m)   Wt 260 lb 6 oz (118.1 kg)   BMI 46.12 kg/m  No LMP recorded.  General:   Alert,  Well-developed, well-nourished, pleasant and cooperative in NAD Head:  Normocephalic and atraumatic. Eyes:  Sclera clear, no icterus.   Conjunctiva pink. Ears:  Normal auditory acuity. Nose:  No deformity, discharge, or lesions. Mouth:  No deformity or lesions,oropharynx pink & moist. Neck: Right lateral tenderness of the neck; no masses or thyromegaly. Lungs:  Respirations even and  unlabored.  Clear throughout to auscultation.   No wheezes, crackles, or rhonchi. No acute distress. Heart:  Regular rate and rhythm; no murmurs, clicks, rubs, or gallops. Abdomen:  Normal bowel sounds. Soft, non-tender and non-distended without masses, hepatosplenomegaly or hernias noted.  No guarding or rebound tenderness.   Rectal: Normal perianal exam, severe point tenderness of posterior and lateral wall of anal canal, empty rectal vault, no gross blood visualized Msk:  Symmetrical without gross deformities. Good, equal movement & strength bilaterally. Pulses:  Normal pulses noted. Extremities:  No clubbing or edema.  No cyanosis. Neurologic:  Alert and oriented x3;  grossly normal neurologically. Skin:  Intact without significant lesions or rashes. No jaundice. Psych:  Alert and cooperative. Normal mood and affect.  Imaging Studies: No abdominal imaging  Assessment and Plan:   Khadeja Abt is a 29 y.o. female with morbid obesity is seen in consultation for management of  diarrhea predominant irritable bowel syndrome, chronic GERD and elevated transaminases, s/p cholecystectomy  Posterior anal fissure Recommend 0.125% nitroglycerin with 5% lidocaine, instructions given Stop desipramine due to constipation Recommend MiraLAX 1-2 times daily along with fiber supplement daily  Elevated LFTs, 2-3 times upper limit of normal: Likely secondary to fatty liver based on right upper quadrant ultrasound Acute viral hepatitis panel negative LFTs are persistently elevated, Reiterated on healthy eating habits, regular physical activity and weight loss Monitor LFTs every 3 months  IBS-diarrhea:  Amitriptyline and desipramine resulted in severe constipation Recommend to stop  Will try 2 weeks course of rifaximin   Chronic GERD Reiterated on antireflux lifestyle Increase omeprazole 40 mg twice daily before meals   Right lateral neck pain Recommend CT of the soft tissue neck   Follow up in 3 months   Arlyss Repress, MD

## 2020-12-05 NOTE — Patient Instructions (Addendum)
[facility] [address], [place] [address]. Phone number [phone]. Please allow at least 24 hours before picking up the compounded cream because the pharmacy has to make the medication.   Please stop desipramine 25mg   Increase omeprazole 40mg  to twice a day for one month  Your CT scan is scheduled for 12/18/2020 check in at 7:45am. At out patient imaging. Nothing to eat or drink after midnight. Address is [address] [place], [address] [place]

## 2020-12-18 ENCOUNTER — Ambulatory Visit
Admission: RE | Admit: 2020-12-18 | Discharge: 2020-12-18 | Disposition: A | Discharge status: Home / Self Care | Payer: Self-pay | Source: Ambulatory Visit | Attending: Gastroenterology | Admitting: Gastroenterology

## 2020-12-18 ENCOUNTER — Telehealth: Payer: Self-pay

## 2020-12-18 ENCOUNTER — Other Ambulatory Visit: Payer: Self-pay

## 2020-12-18 DIAGNOSIS — J358 Other chronic diseases of tonsils and adenoids: Secondary | ICD-10-CM

## 2020-12-18 DIAGNOSIS — M542 Cervicalgia: Secondary | ICD-10-CM | POA: Insufficient documentation

## 2020-12-18 MED ORDER — IOHEXOL 300 MG/ML  SOLN
75.0000 mL | Freq: Once | INTRAMUSCULAR | Status: AC | PRN
  Administered 2020-12-18: 09:00:00 75 mL via INTRAVENOUS

## 2020-12-18 NOTE — Telephone Encounter (Signed)
Patient verbalized understanding of results  

## 2020-12-18 NOTE — Telephone Encounter (Signed)
Faxed referral

## 2020-12-18 NOTE — Telephone Encounter (Signed)
-----   Message from Toney Reil, MD sent at 12/18/2020 10:20 AM EST ----- CT of the neck reveals tonsillolith in her right tonsil, this is a stone.  Please refer her to ENT  Rohini Vanga

## 2020-12-18 NOTE — Telephone Encounter (Signed)
Placed referral to ENT. Called and left a message for call back

## 2020-12-29 IMAGING — CR DG CHEST 2V
1 series · 2 of 2 positions shown · non-contrast
Comparison: None.

CLINICAL DATA: Cough

EXAM:
CHEST - 2 VIEW

[Series 1: dg chest 2 view · 0.14mm/px · 2 of 2 slices shown]
[im 1/2]
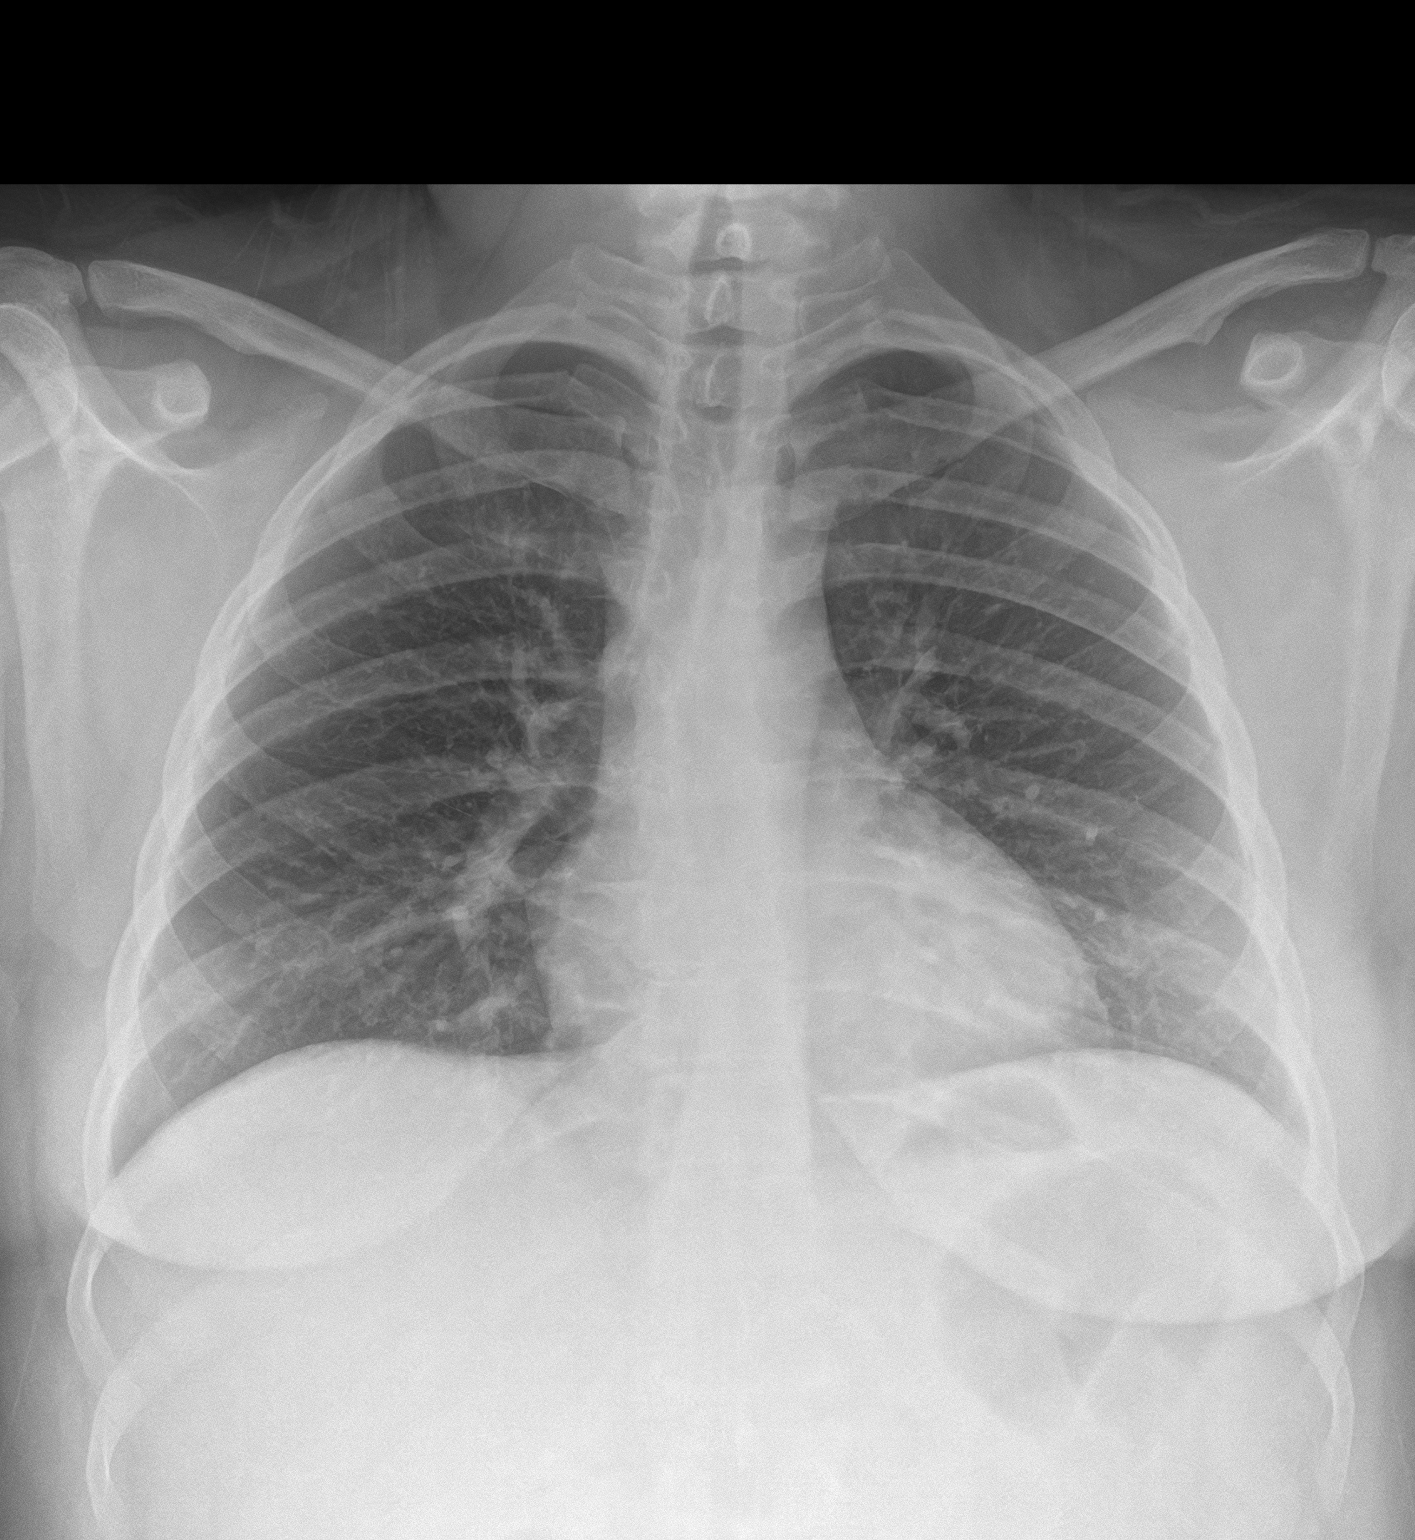
[im 2/2]
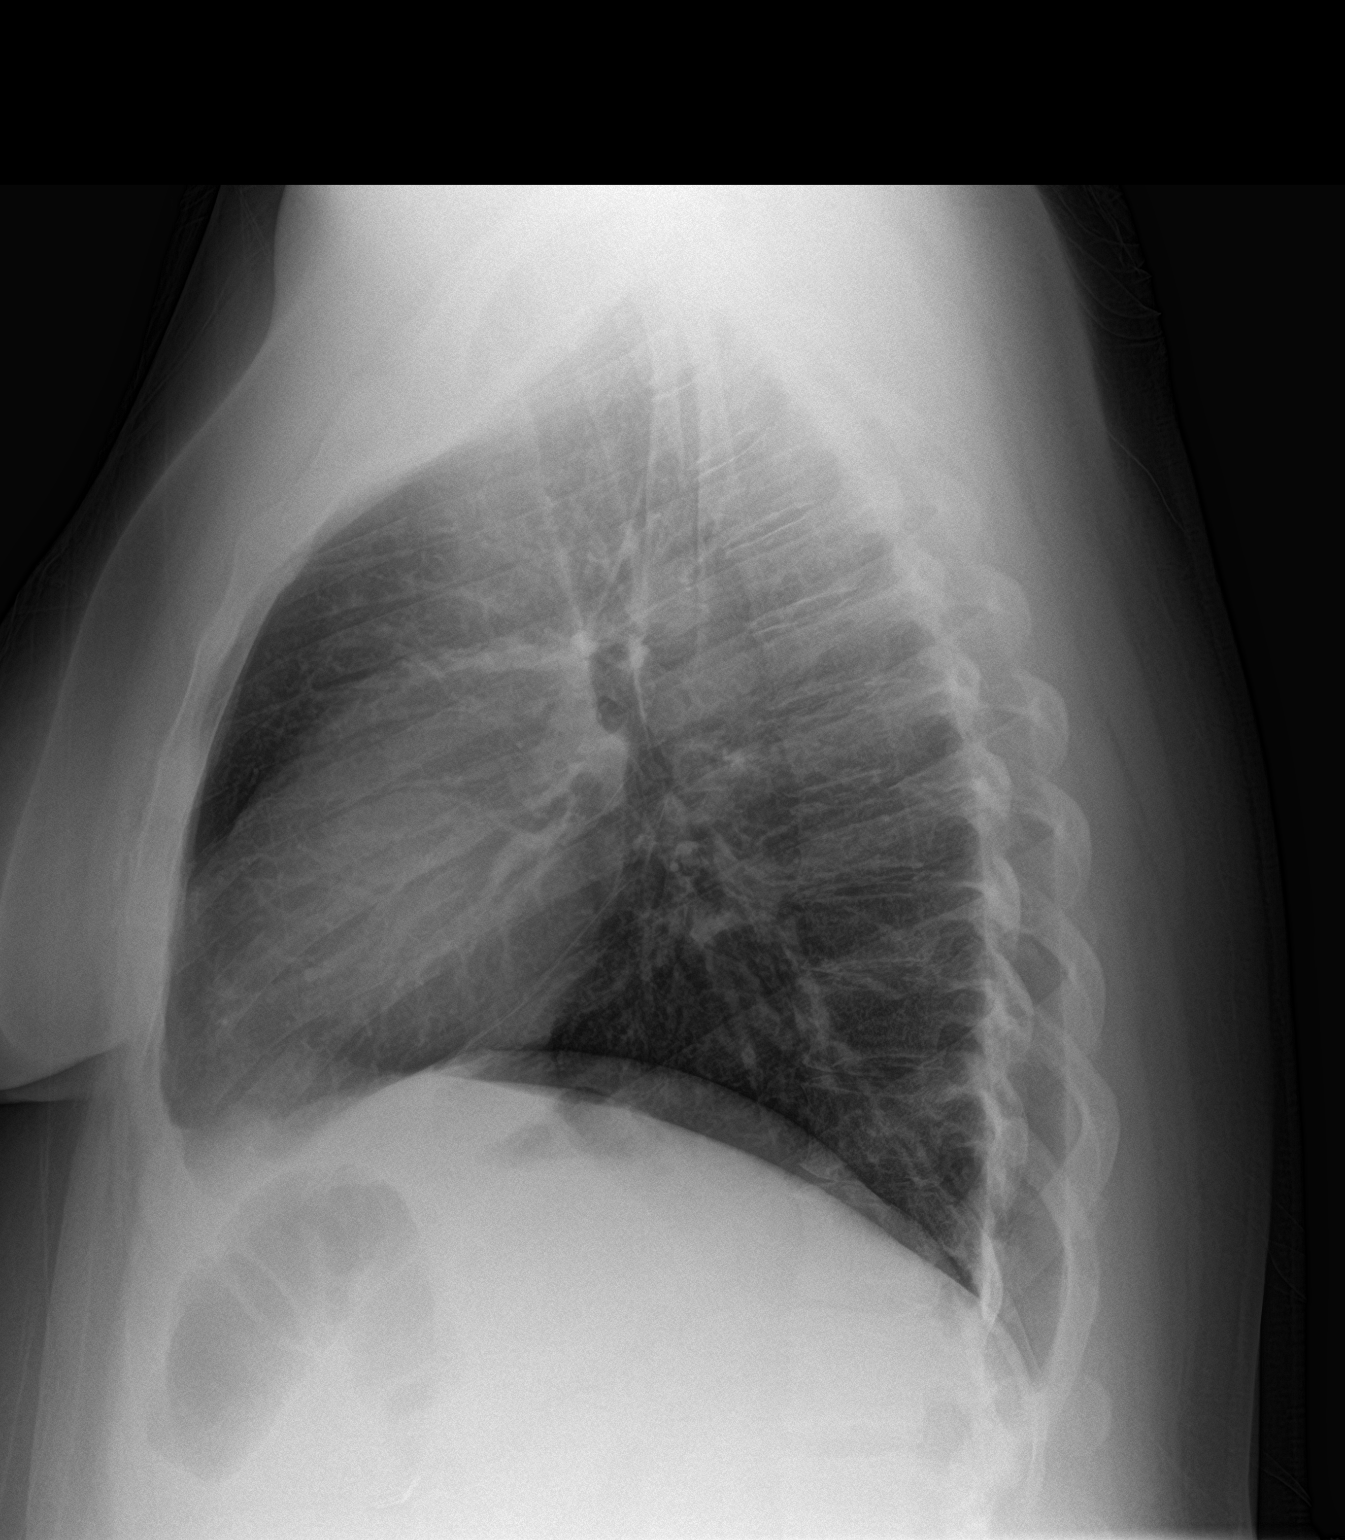

[2 of 2 positions shown; findings below may reference images not displayed]

FINDINGS: The heart size and mediastinal contours are within normal limits.
Both lungs are clear. The visualized skeletal structures are
unremarkable.
IMPRESSION: No active cardiopulmonary disease.

## 2021-01-09 ENCOUNTER — Other Ambulatory Visit: Payer: Self-pay | Admitting: Otolaryngology

## 2021-01-25 ENCOUNTER — Other Ambulatory Visit: Payer: Self-pay

## 2021-01-25 ENCOUNTER — Encounter: Payer: Self-pay | Admitting: Gastroenterology

## 2021-01-25 ENCOUNTER — Ambulatory Visit (INDEPENDENT_AMBULATORY_CARE_PROVIDER_SITE_OTHER): Payer: Self-pay | Admitting: Gastroenterology

## 2021-01-25 VITALS — BP 137/89 | HR 96 | Temp 97.5°F | Ht 63.0 in | Wt 261.0 lb

## 2021-01-25 DIAGNOSIS — K219 Gastro-esophageal reflux disease without esophagitis: Secondary | ICD-10-CM

## 2021-01-25 DIAGNOSIS — J45909 Unspecified asthma, uncomplicated: Secondary | ICD-10-CM

## 2021-01-25 DIAGNOSIS — K580 Irritable bowel syndrome with diarrhea: Secondary | ICD-10-CM

## 2021-01-25 DIAGNOSIS — R7989 Other specified abnormal findings of blood chemistry: Secondary | ICD-10-CM

## 2021-01-25 NOTE — Progress Notes (Signed)
Patricia Repress, MD [address]  [address]  [place], [place] [address]  Main: [phone]  Fax: [phone]    Gastroenterology Consultation  Referring Provider:     No ref. provider found Primary Care Physician:  System, Provider Not In Primary Gastroenterologist:  Dr. Arlyss Bowers Reason for Consultation:     IBS, GERD, fatty liver, elevated LFTs        HPI:   Patricia Bowers is a 30 y.o. female referred by Dr. System, Provider Not In  for consultation & management of IBS, GERD Irritable bowel syndrome: Patient reports that when she was in [place], at age of 40, she was experiencing left-sided abdominal pain associated with abdominal cramps and nonbloody diarrhea.  She denies abdominal bloating.  She said she had undergone extensive work-up and she was told that she has irritable bowel syndrome.  She was taking Bentyl as needed for her symptoms.  Over the course of years, she was experiencing alternating episodes of constipation and diarrhea.  When she moved to [place], in 2018 she was evaluated by gastroenterologist for ongoing symptoms.  At that time, she underwent upper endoscopy as well as colonoscopy which were reportedly normal.  There was no evidence of H. pylori.  Celiac serologies were negative.  TSH normal.  No evidence of anemia.  She was on sublingual hyoscyamine in the past. She does acknowledge significant family stress in her personal life.  Unable to sleep well.  Patient is accompanied by her mom today.  She identified that she cannot tolerate lactose and gluten, therefore avoiding these  Chronic GERD: She reports heartburn, acid reflux for which she is taking pantoprazole.  She denies dysphagia, epigastric pain  Patient went to ER yesterday due to right shoulder pain after she fell last night and landing on her right shoulder.  There was no evidence of fracture.  She was discharged home on meloxicam and tramadol which she did not fill the  prescription yet.  Follow-up televisit 07/14/2020 Patricia Bowers originally seen by me in early June for management of chronic GERD and diarrhea predominant IBS.  Her GI symptoms are predominantly secondary to stress therefore, I started her on amitriptyline 25 mg at bedtime.  Patient notices improvement in her symptoms by 30 to 40%.  She is taking omeprazole 40 mg daily before meals which is helping partially with reflux symptoms.  She does notice nocturnal heartburn.  She is not keeping her head of the bed elevated.  She is also concerned about elevated transaminases.  Acute viral hepatitis panel is negative, will she denies any over-the-counter medication or herbal supplements She does not drink alcohol  Follow-up visit 10/19/2020 Patient reports that amitriptyline is significantly helping with her IBS symptoms.  However, she is now experiencing constipation, her last bowel movement was 3 days ago.  She reports her stools have been hard and associated with significant straining.  She does have history of heartburn which is fairly controlled on omeprazole 40 mg daily.  She lost about 5 to 6 pounds since last visit.  She says that she is trying to incorporate more healthy foods.  She started Crestor and took for 3 days, noticed swelling in the right side of her throat and pain.  She stopped 2 days ago.  She reports that the pain is improving.  Follow-up visit 01/06/2020 Patient made an urgent visit due to worsening of her GI symptoms.  Patient reports that she was constipated over the weekend, followed by taking  a stool softener which has resulted in diarrhea and episodes of bright red blood per rectum.  Patient was concerned about it and she became very tearful.  She also reports ongoing right-sided neck pain that interferes with swallowing.  Patient is tearful in office today.  Desipramine is not helping, rather is causing constipation.  Patient acknowledges spending significant amount of time in  toilet due to straining and pushing, incomplete emptying.  She does report severe rectal pain as well.  Patient is accompanied by her mom  Follow-up visit 01/25/2021 Patient reports her GI symptoms are more or less the same.  For her throat pain, I ordered CT soft tissue neck, found to have tonsillolith.  She was evaluated by ENT who felt that tonsillolith is not the reason for her throat pain as this has been chronically present and it is very small.  They reported that she has severe acid reflux causing laryngeal irritation and severe cough.  Patient's mom today expresses her concern regarding severe secondhand smoking exposure in their apartment building which is triggering asthma attacks requiring multiple ER visits.  Patient's mom is trying to relocate at this time.  Patient does not have a pulmonologist.  She is currently on a nebulizer.  She is currently taking IBgard, try 2 weeks course of rifaximin.  She continues to take omeprazole 40 mg twice daily.  She reports that she is not able to exercise much due to frequent asthma attacks  NSAIDs: None  Antiplts/Anticoagulants/Anti thrombotics: None  GI Procedures: EGD and colonoscopy in 11/05/2017 at [facility] A.  Gastric antrum, biopsy: -Mild reactive gastropathy. -No Helicobacter pylori microorganisms identified on H&E staining.  B.  Distal esophagus, biopsy: -Mucosa of the squamocolumnar junction. -No goblet cell metaplasia identified.  Past Medical History:  Diagnosis Date  . Asthma   . IBS (irritable bowel syndrome)   . Kidney stone     Past Surgical History:  Procedure Laterality Date  . CHOLECYSTECTOMY    . KIDNEY STONE SURGERY    . LASIK      Current Outpatient Medications:  .  albuterol (PROVENTIL) (2.5 MG/3ML) 0.083% nebulizer solution, Take 3 mLs (2.5 mg total) by nebulization every 4 (four) hours as needed for wheezing or shortness of breath., Disp: 75 mL, Rfl: 0 .  albuterol (VENTOLIN HFA) 108 (90 Base) MCG/ACT inhaler,  Inhale 2 puffs into the lungs every 6 (six) hours as needed for wheezing or shortness of breath., Disp: 16 g, Rfl: 0 .  hyoscyamine (LEVSIN) 0.125 MG tablet, Take 1 tablet (0.125 mg total) by mouth every 4 (four) hours as needed., Disp: 30 tablet, Rfl: 0 .  Nebulizers (COMPRESSOR/NEBULIZER) MISC, 1 Units by Does not apply route every 4 (four) hours as needed., Disp: 1 each, Rfl: 0 .  omeprazole (PRILOSEC) 40 MG capsule, Take 1 capsule (40 mg total) by mouth 2 (two) times daily before a meal., Disp: 60 capsule, Rfl: 0 .  rosuvastatin (CRESTOR) 10 MG tablet, Take 1 tablet (10 mg total) by mouth daily., Disp: 90 tablet, Rfl: 1    Family History  Problem Relation Age of Onset  . Breast cancer Mother   . Cancer Mother   . Other Father        unknown medical history     Social History   Tobacco Use  . Smoking status: Never Smoker  . Smokeless tobacco: Never Used  Vaping Use  . Vaping Use: Never used  Substance Use Topics  . Alcohol use: Never  . Drug  use: Never    Allergies as of 01/25/2021 - Review Complete 01/25/2021  Allergen Reaction Noted  . Morphine and related Other (See Comments) 02/23/2020    Review of Systems:    All systems reviewed and negative except where noted in HPI.   Physical Exam:  BP 137/89 (BP Location: Left Arm, Patient Position: Sitting, Cuff Size: Large)   Pulse 96   Temp (!) 97.5 F (36.4 C) (Oral)   Ht 5\' 3"  (1.6 m)   Wt 261 lb (118.4 kg)   BMI 46.23 kg/m  No LMP recorded.  General:   Alert,  Well-developed, well-nourished, pleasant and cooperative in NAD Head:  Normocephalic and atraumatic. Eyes:  Sclera clear, no icterus.   Conjunctiva pink. Ears:  Normal auditory acuity. Nose:  No deformity, discharge, or lesions. Mouth:  No deformity or lesions,oropharynx pink & moist. Neck: no masses or thyromegaly. Lungs:  Respirations even and unlabored.  Clear throughout to auscultation.   No wheezes, crackles, or rhonchi. No acute distress. Heart:   Regular rate and rhythm; no murmurs, clicks, rubs, or gallops. Abdomen:  Normal bowel sounds. Soft, non-tender and non-distended without masses, hepatosplenomegaly or hernias noted.  No guarding or rebound tenderness.   Rectal: Normal perianal exam, severe point tenderness of posterior and lateral wall of anal canal, empty rectal vault, no gross blood visualized Msk:  Symmetrical without gross deformities. Good, equal movement & strength bilaterally. Pulses:  Normal pulses noted. Extremities:  No clubbing or edema.  No cyanosis. Neurologic:  Alert and oriented x3;  grossly normal neurologically. Skin:  Intact without significant lesions or rashes. No jaundice. Psych:  Alert and cooperative. Normal mood and affect.  Imaging Studies: No abdominal imaging  Assessment and Plan:   Patricia Bowers is a 30 y.o. female with morbid obesity is seen in consultation for management of diarrhea predominant irritable bowel syndrome, chronic GERD and elevated transaminases, s/p cholecystectomy  Elevated LFTs, 2-3 times upper limit of normal: Likely secondary to fatty liver based on right upper quadrant ultrasound Acute viral hepatitis panel negative LFTs are persistently elevated, Reiterated on healthy eating habits, regular physical activity and weight loss Monitor LFTs every 3 months, if still elevated today, will perform secondary liver disease work-up and liver biopsy  IBS-diarrhea:  Amitriptyline and desipramine resulted in severe constipation, therefore discontinued S/p 2 weeks course of rifaximin  Continue IBgard  Chronic GERD Reiterated on antireflux lifestyle Continue omeprazole 40 mg twice daily before meals   Posterior anal fissure Continue 0.125% nitroglycerin with 5% lidocaine, instructions given Stopped desipramine due to constipation Recommend MiraLAX 1-2 times daily along with fiber supplement daily   Follow up in 3 months   26, MD

## 2021-01-26 ENCOUNTER — Other Ambulatory Visit: Payer: Self-pay | Admitting: Gastroenterology

## 2021-01-26 ENCOUNTER — Other Ambulatory Visit: Payer: Self-pay

## 2021-01-26 ENCOUNTER — Telehealth: Payer: Self-pay

## 2021-01-26 DIAGNOSIS — R7989 Other specified abnormal findings of blood chemistry: Secondary | ICD-10-CM

## 2021-01-26 LAB — HEPATIC FUNCTION PANEL
ALT: 110 IU/L — ABNORMAL HIGH (ref 0–32)
AST: 66 IU/L — ABNORMAL HIGH (ref 0–40)
Albumin: 4.6 g/dL (ref 3.9–5.0)
Alkaline Phosphatase: 83 IU/L (ref 44–121)
Bilirubin Total: 0.7 mg/dL (ref 0.0–1.2)
Bilirubin, Direct: 0.19 mg/dL (ref 0.00–0.40)
Total Protein: 7.4 g/dL (ref 6.0–8.5)

## 2021-01-26 NOTE — Telephone Encounter (Signed)
-----   Message from Toney Reil, MD sent at 01/26/2021  9:41 AM EST ----- Liver enzymes are persistently elevated.  Recommend secondary liver disease work-up, labs are ordered  RV

## 2021-01-26 NOTE — Telephone Encounter (Signed)
Patient verbalized understanding and states she will go get lab work

## 2021-01-26 NOTE — Telephone Encounter (Signed)
Released labs per Dr. Allegra Lai called and left a message for call back

## 2021-01-29 ENCOUNTER — Ambulatory Visit: Payer: Self-pay | Admitting: Gastroenterology

## 2021-01-30 ENCOUNTER — Telehealth: Payer: Self-pay | Admitting: Gastroenterology

## 2021-01-30 ENCOUNTER — Other Ambulatory Visit
Admission: RE | Admit: 2021-01-30 | Discharge: 2021-01-30 | Disposition: A | Discharge status: Home / Self Care | Payer: Self-pay | Source: Ambulatory Visit | Attending: Gastroenterology | Admitting: Gastroenterology

## 2021-01-30 DIAGNOSIS — K760 Fatty (change of) liver, not elsewhere classified: Secondary | ICD-10-CM | POA: Insufficient documentation

## 2021-01-30 LAB — FERRITIN: Ferritin: 40 ng/mL (ref 11–307)

## 2021-01-30 LAB — IRON AND TIBC
Iron: 59 ug/dL (ref 28–170)
Saturation Ratios: 14 % (ref 10.4–31.8)
TIBC: 410 ug/dL (ref 250–450)
UIBC: 351 ug/dL

## 2021-01-30 NOTE — Telephone Encounter (Signed)
Patient confused about her liver biopsy, please call her.

## 2021-01-30 NOTE — Telephone Encounter (Signed)
Patient states she went to [facility] because she thought she was getting the liver biopsy informed her that she was not she was just getting lab work that is more in detail of her liver. She states she has no insurance and they wanted to charge her 700 dollars. She states she has charity care through [facility] and can go to [facility] for free to have the lab  Work. Informed her that she can go and get the lab work done there. She states she is on her way now

## 2021-01-30 NOTE — Telephone Encounter (Signed)
Patient called and is confused about liver biopsy, please call her.

## 2021-01-31 LAB — CERULOPLASMIN: Ceruloplasmin: 26.5 mg/dL (ref 19.0–39.0)

## 2021-01-31 LAB — ANTI-SMOOTH MUSCLE ANTIBODY, IGG: F-Actin IgG: 5 Units (ref 0–19)

## 2021-01-31 LAB — ANA W/REFLEX IF POSITIVE: Anti Nuclear Antibody (ANA): NEGATIVE

## 2021-01-31 LAB — MITOCHONDRIAL ANTIBODIES: Mitochondrial M2 Ab, IgG: <20 Units (ref 0.0–20.0)

## 2021-01-31 LAB — TISSUE TRANSGLUTAMINASE, IGA: Tissue Transglutaminase Ab, IgA: <2 U/mL (ref 0–3)

## 2021-01-31 LAB — ANTI-MICROSOMAL ANTIBODY LIVER / KIDNEY: LKM1 Ab: 1 Units (ref 0.0–20.0)

## 2021-02-01 LAB — BETA-2-GLYCOPROTEIN I ABS, IGG/M/A
Beta-2 Glyco I IgG: <9 GPI IgG units (ref 0–20)
Beta-2-Glycoprotein I IgA: <9 GPI IgA units (ref 0–25)
Beta-2-Glycoprotein I IgM: <9 GPI IgM units (ref 0–32)

## 2021-02-01 LAB — ALPHA-1 ANTITRYPSIN PHENOTYPE: A-1 Antitrypsin, Ser: 113 mg/dL (ref 100–188)

## 2021-02-06 ENCOUNTER — Other Ambulatory Visit: Payer: Self-pay | Admitting: "Endocrinology

## 2021-02-06 IMAGING — CT CT NECK W/ CM
5 series · 16 of 34 positions shown, 18 images · IV contrast (omnipaque)
Comparison: None.

CLINICAL DATA: Neck pain

EXAM:
CT NECK WITH CONTRAST
TECHNIQUE: Multidetector CT imaging of the neck was performed using the
standard protocol following the bolus administration of intravenous
contrast.
CONTRAST:  75mL OMNIPAQUE IOHEXOL 300 MG/ML  SOLN

[Series 2: axial neck neck (person_name) 2.00 · axial · 0.65mm/px · z∈[-641,-565]mm · 2 of 115 slices shown]
[im 39/115  bone]
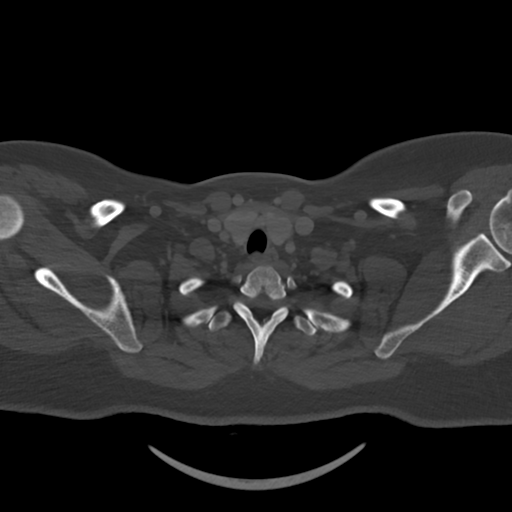
[im 77/115  bone]
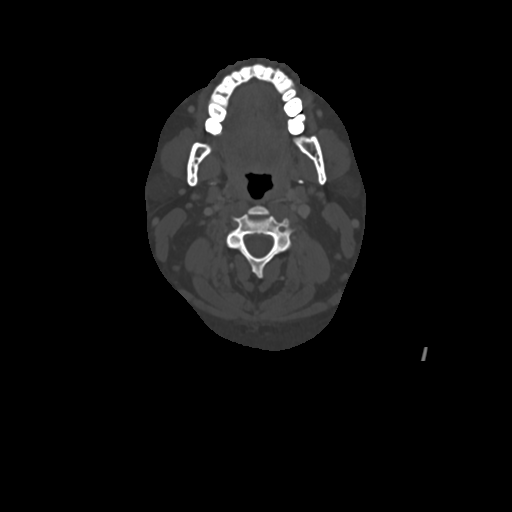

[Series 3: axial bone neck 2.00 · axial · 0.65mm/px · z∈[-641,-565]mm · 2 of 115 slices shown]
[im 39/115  bone]
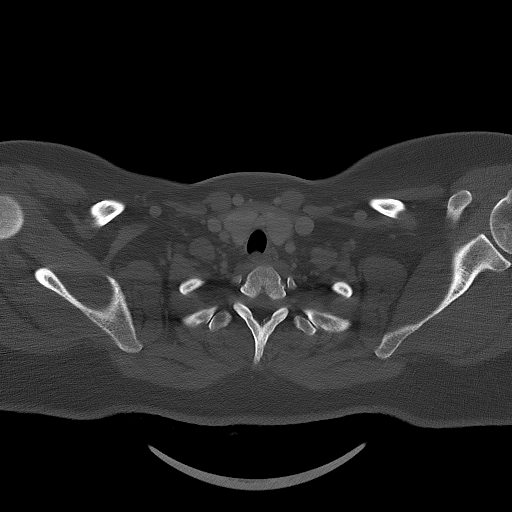
[im 77/115  bone]
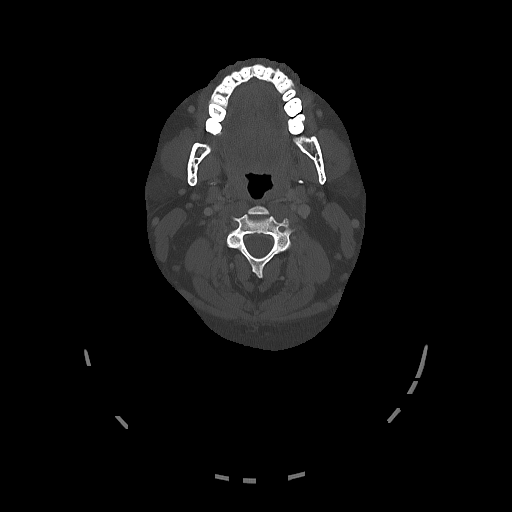

[Series 4: coronal neck neck (person_name) 2.00 cor · coronal · 0.61mm/px · 3 of 149 slices shown]
[im 57/149  bone]
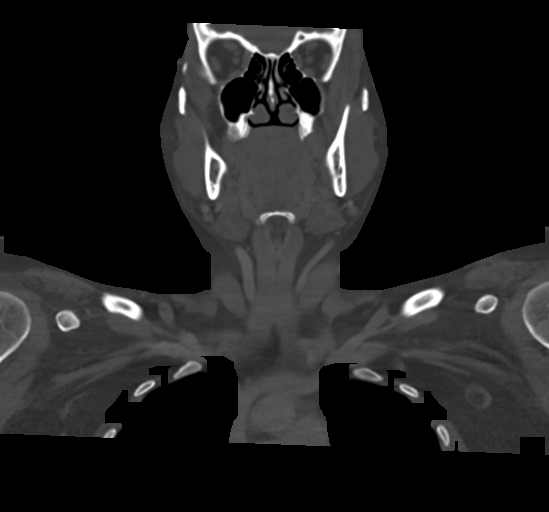
[im 69/149  bone]
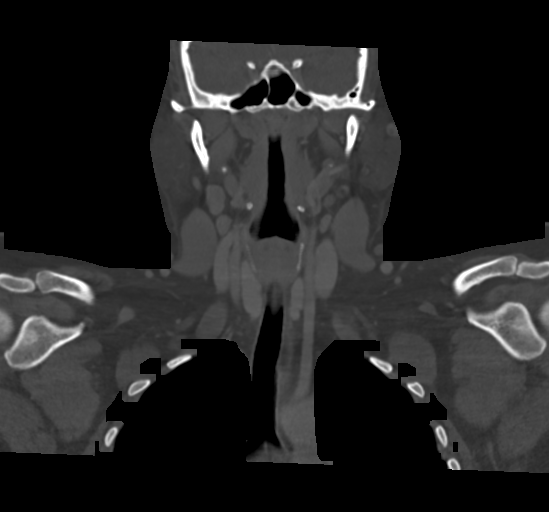
[im 80/149  bone]
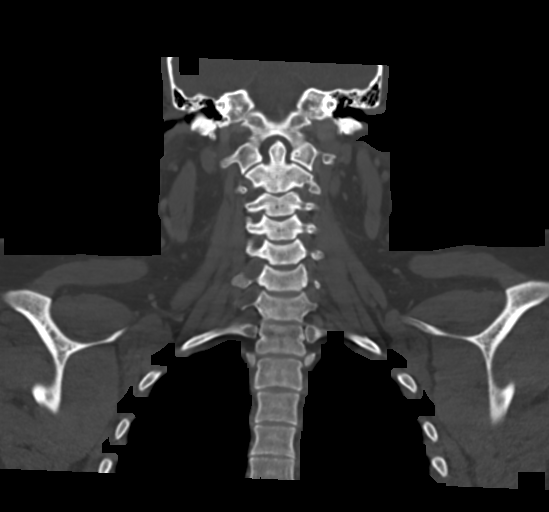

[Series 6: sagittal neck neck (person_name) 2.00 sag · sagittal · 0.58mm/px · 5 of 167 slices shown, 6 images]
[im 56/167  bone]
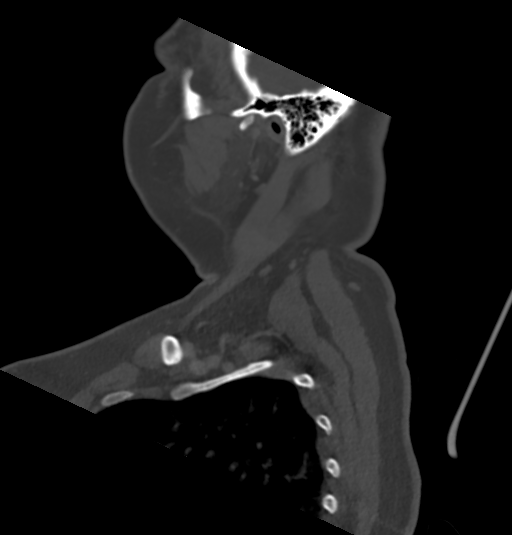
[im 70/167  bone]
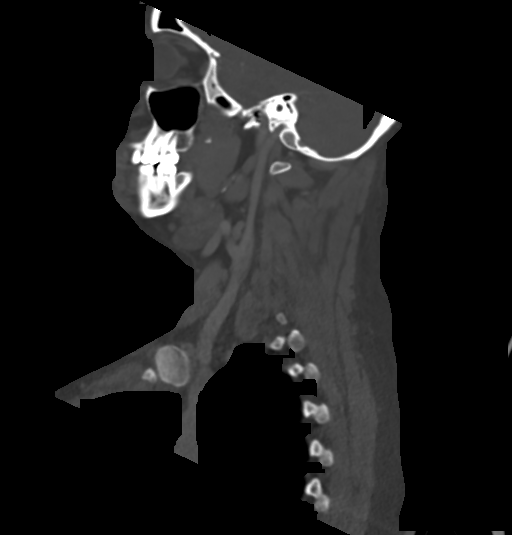
[im 84/167  soft-tissue]
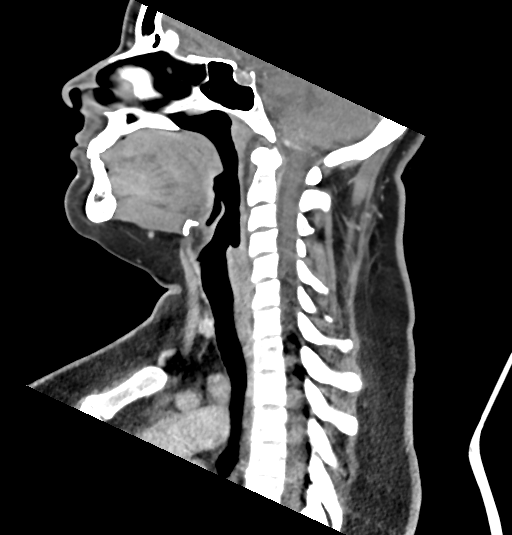
[im 84/167  bone]
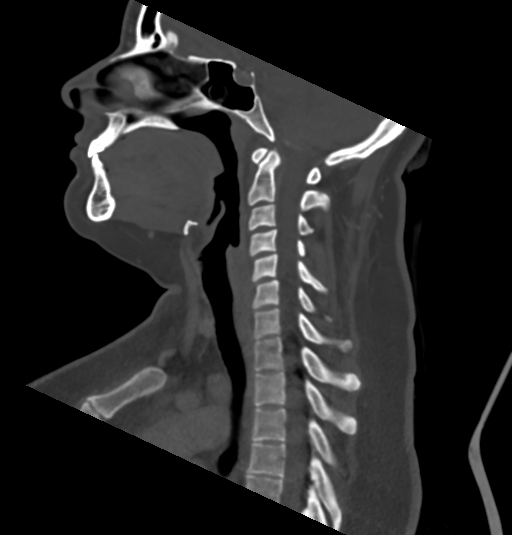
[im 97/167  bone]
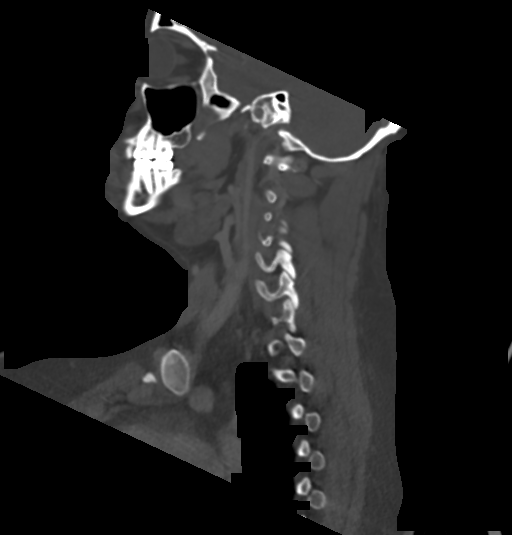
[im 111/167  bone]
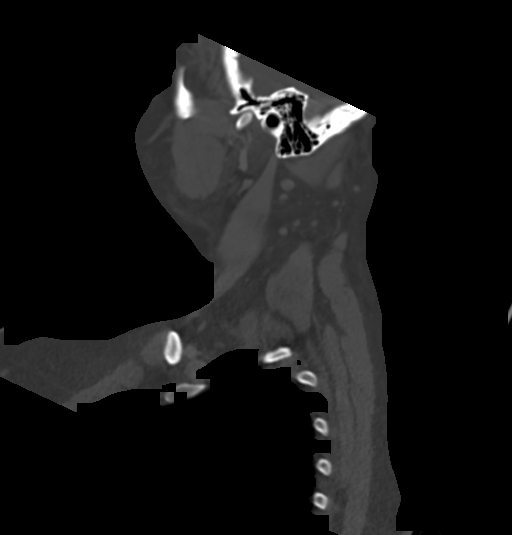

[Series 8: ax oropharynx neck neck (person_name) 2.00 ax · axial · 0.58mm/px · z∈[-752,-583]mm · 4 of 156 slices shown, 5 images]
[im 32/156  soft-tissue]
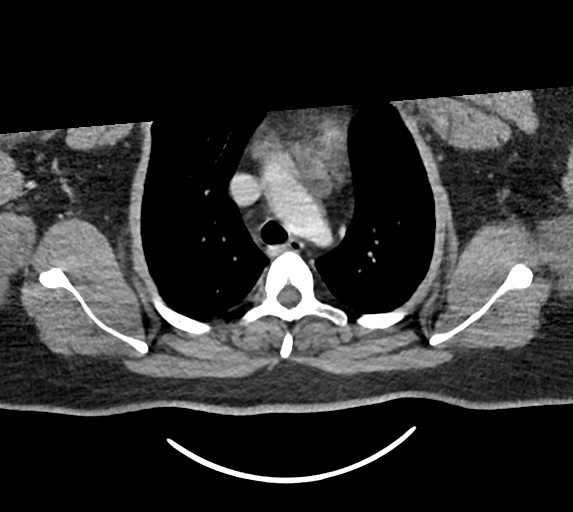
[im 32/156  bone]
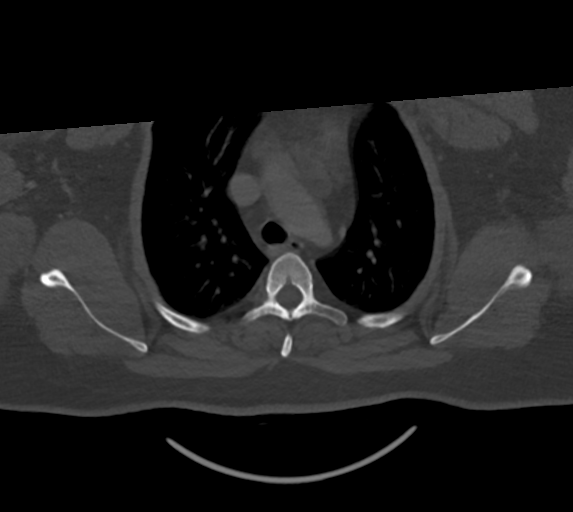
[im 63/156  bone]
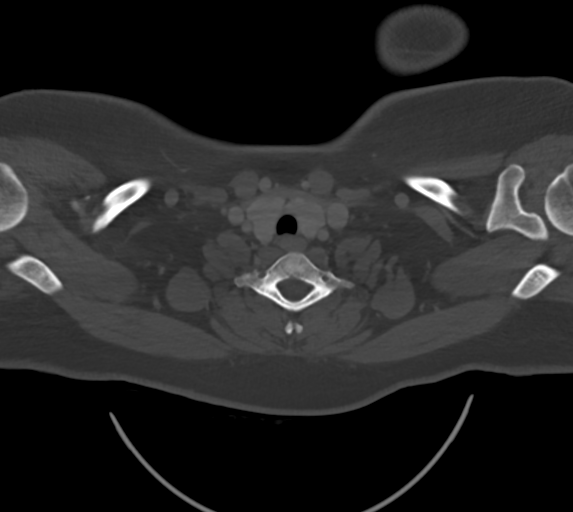
[im 94/156  bone]
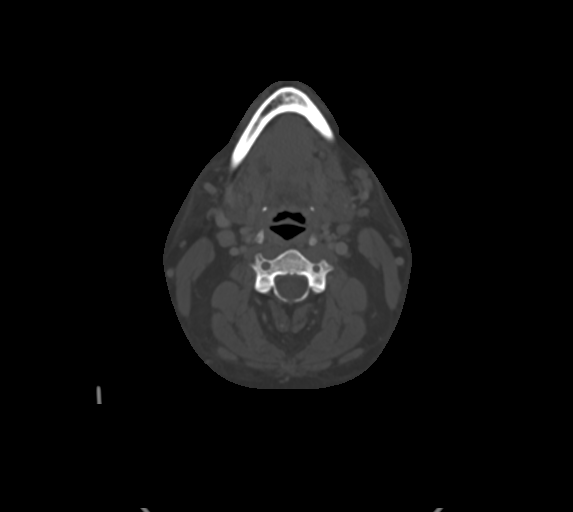
[im 125/156  bone]
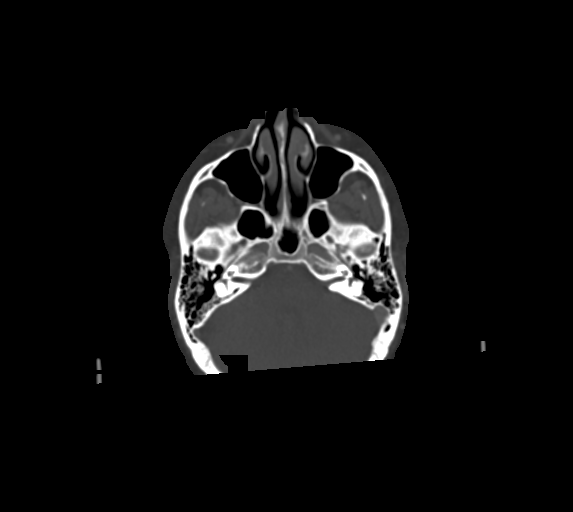

[16 of 34 positions shown; findings below may reference images not displayed]

FINDINGS: Pharynx and larynx: Right tonsillolith.  No mass or swelling.

Salivary glands: No inflammation, mass, or stone.

Thyroid: Normal.

Lymph nodes: None enlarged or abnormal density. Although there is an
asymmetry of the jugulodigastric lymph nodes, right larger than
left, short axis measurement is still within normal limits.

Vascular: Negative.

Limited intracranial: Negative.

Visualized orbits: Negative.

Mastoids and visualized paranasal sinuses: Clear.

Skeleton: No acute or aggressive process.

Upper chest: Negative.

Other: None.
IMPRESSION: 1. No neck mass or adenopathy.
2. Right tonsillolith.

## 2021-02-07 ENCOUNTER — Other Ambulatory Visit: Payer: Self-pay | Admitting: "Endocrinology

## 2021-02-22 ENCOUNTER — Ambulatory Visit: Payer: Self-pay | Admitting: "Endocrinology

## 2021-02-22 ENCOUNTER — Encounter: Payer: Self-pay | Admitting: Nutrition

## 2021-03-09 ENCOUNTER — Telehealth: Payer: Self-pay | Admitting: Pulmonary Disease

## 2021-03-09 NOTE — Telephone Encounter (Signed)
Made good faith estimate call on 03/09/21. LVMTCB.

## 2021-03-14 ENCOUNTER — Ambulatory Visit: Payer: Self-pay | Admitting: Gerontology

## 2021-03-14 ENCOUNTER — Institutional Professional Consult (permissible substitution): Payer: Self-pay | Admitting: Pulmonary Disease

## 2021-03-15 ENCOUNTER — Telehealth: Payer: Self-pay | Admitting: Pharmacy Technician

## 2021-03-15 NOTE — Telephone Encounter (Signed)
Annual re-certification packet returned to [facility] due to incorrect address.  Attempted to contact patient via telephone.  Unable to reach.  Left message.  Have not heard from patient.  Sherilyn Dacosta Care Manager [facility]

## 2021-03-20 ENCOUNTER — Telehealth: Payer: Self-pay | Admitting: Pharmacy Technician

## 2021-03-20 NOTE — Telephone Encounter (Signed)
Patient failed to provide 2022 proof of income.  No additional medication assistance will be provided by [facility] without the required proof of income documentation.  Patient notified by letter.  Sherilyn Dacosta Care Manager [facility]   [place] [address] [place], [place]  [address]    March 19, 2021    Lailie Kirker [place] [place], [place]  [address]  Dear Lady Gary:  This is to inform you that you are no longer eligible to receive medication assistance at [facility].  The reason(s) are:    _____Your total gross monthly household income exceeds 250% of the Federal Poverty Level.   _____Tangible assets (savings, checking, stocks/bonds, pension, retirement, etc.) exceeds our limit  _____You are eligible to receive benefits from [facility], [facility] or HIV Medication              Assistance Program _____You are eligible to receive benefits from a Medicare Part "D" plan _____You have prescription insurance  _____You are not an [facility] resident __X__Failure to provide all requested proof of income information for 2022.    Medication assistance will resume once all requested financial information has been returned to our clinic.  If you have questions, please contact our clinic at [phone].    Thank you,  [facility]

## 2021-04-02 ENCOUNTER — Other Ambulatory Visit: Payer: Self-pay

## 2021-05-01 ENCOUNTER — Ambulatory Visit: Payer: Self-pay | Admitting: Gastroenterology

## 2021-05-18 ENCOUNTER — Other Ambulatory Visit: Payer: Self-pay

## 2021-06-06 ENCOUNTER — Other Ambulatory Visit: Payer: Self-pay

## 2021-07-19 ENCOUNTER — Other Ambulatory Visit: Payer: Self-pay

## 2021-11-08 ENCOUNTER — Other Ambulatory Visit: Payer: Self-pay
